# Patient Record
Sex: Male | Born: 1955 | Race: White | Hispanic: No | Marital: Married | Smoking: Former smoker
Health system: Southern US, Community
[De-identification: ages and names within clinical notes are randomized; demographics above are authoritative.]

## PROBLEM LIST (undated history)

## (undated) DIAGNOSIS — I10 Essential (primary) hypertension: Secondary | ICD-10-CM

## (undated) DIAGNOSIS — E785 Hyperlipidemia, unspecified: Secondary | ICD-10-CM

## (undated) DIAGNOSIS — I4891 Unspecified atrial fibrillation: Secondary | ICD-10-CM

## (undated) HISTORY — DX: Unspecified atrial fibrillation: I48.91

## (undated) HISTORY — DX: Essential (primary) hypertension: I10

## (undated) HISTORY — PX: APPENDECTOMY: SHX54

## (undated) HISTORY — PX: TONSILLECTOMY: SUR1361

## (undated) HISTORY — DX: Hyperlipidemia, unspecified: E78.5

## (undated) HISTORY — PX: HAMMER TOE SURGERY: SHX385

---

## 2013-12-24 ENCOUNTER — Other Ambulatory Visit: Payer: Self-pay

## 2013-12-24 DIAGNOSIS — R002 Palpitations: Secondary | ICD-10-CM

## 2013-12-30 ENCOUNTER — Encounter (INDEPENDENT_AMBULATORY_CARE_PROVIDER_SITE_OTHER): Payer: BC Managed Care – PPO

## 2013-12-30 ENCOUNTER — Encounter: Payer: Self-pay | Admitting: Radiology

## 2013-12-30 DIAGNOSIS — R002 Palpitations: Secondary | ICD-10-CM

## 2013-12-30 NOTE — Progress Notes (Signed)
Patient ID: Jon Ortiz, male   DOB: [birth year], 58 y.o.   MRN: [id number] E cardio 24hr holter applied

## 2015-01-05 DIAGNOSIS — I480 Paroxysmal atrial fibrillation: Secondary | ICD-10-CM | POA: Insufficient documentation

## 2015-01-05 HISTORY — DX: Paroxysmal atrial fibrillation: I48.0

## 2015-05-28 DIAGNOSIS — G4733 Obstructive sleep apnea (adult) (pediatric): Secondary | ICD-10-CM | POA: Insufficient documentation

## 2015-05-28 HISTORY — DX: Obstructive sleep apnea (adult) (pediatric): G47.33

## 2017-03-29 DIAGNOSIS — I10 Essential (primary) hypertension: Secondary | ICD-10-CM

## 2017-03-29 DIAGNOSIS — E785 Hyperlipidemia, unspecified: Secondary | ICD-10-CM

## 2017-03-29 HISTORY — DX: Hyperlipidemia, unspecified: E78.5

## 2017-03-29 HISTORY — DX: Essential (primary) hypertension: I10

## 2017-03-30 ENCOUNTER — Ambulatory Visit (INDEPENDENT_AMBULATORY_CARE_PROVIDER_SITE_OTHER): Payer: BLUE CROSS/BLUE SHIELD | Admitting: Cardiology

## 2017-03-30 ENCOUNTER — Encounter: Payer: Self-pay | Admitting: Cardiology

## 2017-03-30 VITALS — BP 120/80 | HR 60 | Ht 72.0 in | Wt 286.0 lb

## 2017-03-30 DIAGNOSIS — G4733 Obstructive sleep apnea (adult) (pediatric): Secondary | ICD-10-CM

## 2017-03-30 DIAGNOSIS — I480 Paroxysmal atrial fibrillation: Secondary | ICD-10-CM | POA: Diagnosis not present

## 2017-03-30 DIAGNOSIS — I10 Essential (primary) hypertension: Secondary | ICD-10-CM

## 2017-03-30 MED ORDER — METOPROLOL SUCCINATE ER 25 MG PO TB24: 25.0000 mg | ORAL_TABLET | Freq: Every day | ORAL | 1 refills | Status: DC

## 2017-03-30 MED ORDER — PRAVASTATIN SODIUM 10 MG PO TABS: 10.0000 mg | ORAL_TABLET | Freq: Every day | ORAL | 1 refills | Status: DC

## 2017-03-30 MED ORDER — FLECAINIDE ACETATE 100 MG PO TABS: 100.0000 mg | ORAL_TABLET | Freq: Every day | ORAL | 1 refills | Status: DC

## 2017-03-30 MED ORDER — TRIAMTERENE-HCTZ 37.5-25 MG PO TABS: 1.0000 | ORAL_TABLET | Freq: Every day | ORAL | 1 refills | Status: DC

## 2017-03-30 NOTE — Progress Notes (Signed)
Cardiology Office Note:    Date:  03/30/2017   ID:  Jon Ortiz, DOB [birth year], MRN [id number]  PCP:  Ailene RavelHamrick, Maura L, MD  Cardiologist:  Garwin Brothersajan R Revankar, MD   Referring MD: Ailene RavelHamrick, Maura L, MD    ASSESSMENT:    1. Essential hypertension   2. Paroxysmal atrial fibrillation (HCC)   3. OSA (obstructive sleep apnea)    PLAN:    In order of problems listed above:  1. I discussed with the patient atrial fibrillation, disease process. Management and therapy including rate and rhythm control, anticoagulation benefits and potential risks were discussed extensively with the patient. Patient had multiple questions which were answered to patient's satisfaction. 2. Patient continues to take a full-strength aspirin on a daily basis.  His chads score is 1.  He mentions to me that he is now going to exercise and walk aggressively to lose weight.  His atrial fibrillation is overall stable.  His lipids are followed by his primary care physician.  Sleep health issues were discussed.  I told him that they do have a bearing on his atrial fibrillation and he vocalized understanding. 3. Patient will be seen in follow-up appointment in 6 months or earlier if the patient has any concerns 4.    Medication Adjustments/Labs and Tests Ordered: Current medicines are reviewed at length with the patient today.  Concerns regarding medicines are outlined above.  Orders Placed This Encounter  Procedures  . EKG 12-Lead   Meds ordered this encounter  Medications  . flecainide (TAMBOCOR) 100 MG tablet    Sig: Take 1 tablet (100 mg total) by mouth daily.    Dispense:  90 tablet    Refill:  1  . metoprolol succinate (TOPROL-XL) 25 MG 24 hr tablet    Sig: Take 1 tablet (25 mg total) by mouth daily.    Dispense:  90 tablet    Refill:  1  . pravastatin (PRAVACHOL) 10 MG tablet    Sig: Take 1 tablet (10 mg total) by mouth daily.    Dispense:  90 tablet    Refill:  1  . triamterene-hydrochlorothiazide  (MAXZIDE-25) 37.5-25 MG tablet    Sig: Take 1 tablet by mouth daily.    Dispense:  90 tablet    Refill:  1     History of Present Illness:    Jon Ortiz is a 62 y.o. male who is being seen today for the evaluation of paroxysmal atrial fibrillation at the request of Hamrick, Durward FortesMaura L, MD.  This patient has been under my care in my previous practice.  He is here now to transfer his care and be established with my current practice.  Patient has been seen by me in the remote past.  He denies any chest pain orthopnea or PND.  He takes care of activities of daily living.  He does not exercise on a regular basis.  He tells me that he has started exercising but hurt his knee and is now waiting for recovery.  At the time of my evaluation, the patient is alert awake oriented and in no distress.  He mentions to me that he had 2 episodes of palpitations indicative or suggestive of atrial fibrillation since he saw me last and that resolved by themselves.  He wants a refill of his medications today.  Past Medical History:  Diagnosis Date  . A-fib (HCC)   . Hyperlipidemia   . Hypertension     History reviewed. No pertinent surgical history.  Current Medications: Current Meds  Medication Sig  . aspirin 325 MG tablet Take 1 tablet by mouth daily.  Marland Kitchen Co-Enzyme Q-10 30 MG CAPS Take 100 mg by mouth daily.  . flecainide (TAMBOCOR) 100 MG tablet Take 1 tablet (100 mg total) by mouth daily.  . metoprolol succinate (TOPROL-XL) 25 MG 24 hr tablet Take 1 tablet (25 mg total) by mouth daily.  . Multiple Vitamin (MULTI-VITAMINS) TABS Take 1 tablet by mouth daily.  . pravastatin (PRAVACHOL) 10 MG tablet Take 1 tablet (10 mg total) by mouth daily.  Marland Kitchen RA KRILL OIL 500 MG CAPS Take 1 capsule by mouth daily.  Marland Kitchen triamterene-hydrochlorothiazide (MAXZIDE-25) 37.5-25 MG tablet Take 1 tablet by mouth daily.  . [DISCONTINUED] flecainide (TAMBOCOR) 100 MG tablet Take 1 tablet by mouth daily.  . [DISCONTINUED]  metoprolol succinate (TOPROL-XL) 25 MG 24 hr tablet Take 1 tablet by mouth daily.  . [DISCONTINUED] pravastatin (PRAVACHOL) 10 MG tablet Take 1 tablet by mouth daily.  . [DISCONTINUED] triamterene-hydrochlorothiazide (MAXZIDE-25) 37.5-25 MG tablet Take 1 tablet by mouth daily.     Allergies:   Patient has no known allergies.   Social History   Socioeconomic History  . Marital status: Married    Spouse name: None  . Number of children: None  . Years of education: None  . Highest education level: None  Social Needs  . Financial resource strain: None  . Food insecurity - worry: None  . Food insecurity - inability: None  . Transportation needs - medical: None  . Transportation needs - non-medical: None  Occupational History  . None  Tobacco Use  . Smoking status: Former Games developer  . Smokeless tobacco: Never Used  Substance and Sexual Activity  . Alcohol use: No    Frequency: Never  . Drug use: No  . Sexual activity: None  Other Topics Concern  . None  Social History Narrative  . None     Family History: The patient's family history includes Arrhythmia in his father; Heart disease in his father; Valvular heart disease in his mother.  ROS:   Please see the history of present illness.    All other systems reviewed and are negative.  EKGs/Labs/Other Studies Reviewed:    The following studies were reviewed today: I reviewed records and the patient showed me the lab work from his primary care physician's office on his form.  EKG done today reveals sinus rhythm, first-degree AV block and nonspecific ST changes QRS was within normal limits.   Recent Labs: No results found for requested labs within last 8760 hours.  Recent Lipid Panel No results found for: CHOL, TRIG, HDL, CHOLHDL, VLDL, LDLCALC, LDLDIRECT  Physical Exam:    VS:  BP 120/80 (BP Location: Left Arm, Patient Position: Sitting, Cuff Size: Normal)   Pulse 60   Ht 6' (1.829 m)   Wt 286 lb (129.7 kg)   SpO2 98%    BMI 38.79 kg/m     Wt Readings from Last 3 Encounters:  03/30/17 286 lb (129.7 kg)     GEN: Patient is in no acute distress HEENT: Normal NECK: No JVD; No carotid bruits LYMPHATICS: No lymphadenopathy CARDIAC: S1 S2 regular, 2/6 systolic murmur at the apex. RESPIRATORY:  Clear to auscultation without rales, wheezing or rhonchi  ABDOMEN: Soft, non-tender, non-distended MUSCULOSKELETAL:  No edema; No deformity  SKIN: Warm and dry NEUROLOGIC:  Alert and oriented x 3 PSYCHIATRIC:  Normal affect    Signed, Garwin Brothers, MD  03/30/2017 11:26 AM  [facility]

## 2017-03-30 NOTE — Patient Instructions (Signed)
Medication Instructions:  Your physician recommends that you continue on your current medications as directed. Please refer to the Current Medication list given to you today.  Labwork: None ordered  Testing/Procedures: None ordered  Follow-Up: Your physician recommends that you schedule a follow-up appointment in: 7 months with Dr. Revankar   Any Other Special Instructions Will Be Listed Below (If Applicable).     If you need a refill on your cardiac medications before your next appointment, please call your pharmacy.   

## 2017-04-14 ENCOUNTER — Other Ambulatory Visit: Payer: Self-pay

## 2017-04-14 ENCOUNTER — Telehealth: Payer: Self-pay | Admitting: Cardiology

## 2017-04-14 MED ORDER — FLECAINIDE ACETATE 100 MG PO TABS: 100.0000 mg | ORAL_TABLET | Freq: Two times a day (BID) | ORAL | 1 refills | Status: DC

## 2017-04-14 NOTE — Telephone Encounter (Signed)
Refill changed for BID.

## 2017-04-14 NOTE — Telephone Encounter (Signed)
Patient is calling about Flecanide changes he states that he has always took it twice daily and it was called in for only 1 daily, please call patient.

## 2017-08-23 ENCOUNTER — Telehealth: Payer: Self-pay | Admitting: Cardiology

## 2017-08-23 NOTE — Telephone Encounter (Signed)
Patient states he is taking two beta blockers and gets tired while driving. Is this normal?

## 2017-08-23 NOTE — Telephone Encounter (Signed)
Left voicemail informing patient [facility] is only to be taking beta blocker once daily.

## 2017-10-04 ENCOUNTER — Telehealth: Payer: Self-pay | Admitting: Cardiology

## 2017-10-04 NOTE — Telephone Encounter (Signed)
Call Metoprolol 25mg , Lovastatin 10mg , trimetarine HCTZ and Flecinide to [facility] on [address]

## 2017-10-05 ENCOUNTER — Other Ambulatory Visit: Payer: Self-pay

## 2017-10-05 MED ORDER — TRIAMTERENE-HCTZ 37.5-25 MG PO TABS: 1.0000 | ORAL_TABLET | Freq: Every day | ORAL | 0 refills | Status: DC

## 2017-10-05 MED ORDER — FLECAINIDE ACETATE 100 MG PO TABS: 100.0000 mg | ORAL_TABLET | Freq: Two times a day (BID) | ORAL | 0 refills | Status: DC

## 2017-10-05 MED ORDER — PRAVASTATIN SODIUM 10 MG PO TABS: 10.0000 mg | ORAL_TABLET | Freq: Every day | ORAL | 0 refills | Status: DC

## 2017-10-05 MED ORDER — METOPROLOL SUCCINATE ER 25 MG PO TB24: 25.0000 mg | ORAL_TABLET | Freq: Every day | ORAL | 0 refills | Status: DC

## 2017-10-05 NOTE — Telephone Encounter (Signed)
Med refill sent to the pharmacy.

## 2017-10-30 ENCOUNTER — Ambulatory Visit (INDEPENDENT_AMBULATORY_CARE_PROVIDER_SITE_OTHER): Payer: BLUE CROSS/BLUE SHIELD | Admitting: Cardiology

## 2017-10-30 ENCOUNTER — Encounter: Payer: Self-pay | Admitting: Cardiology

## 2017-10-30 VITALS — BP 128/88 | HR 57 | Wt 286.4 lb

## 2017-10-30 DIAGNOSIS — G4733 Obstructive sleep apnea (adult) (pediatric): Secondary | ICD-10-CM

## 2017-10-30 DIAGNOSIS — E785 Hyperlipidemia, unspecified: Secondary | ICD-10-CM | POA: Diagnosis not present

## 2017-10-30 DIAGNOSIS — I480 Paroxysmal atrial fibrillation: Secondary | ICD-10-CM

## 2017-10-30 DIAGNOSIS — I10 Essential (primary) hypertension: Secondary | ICD-10-CM | POA: Diagnosis not present

## 2017-10-30 MED ORDER — TRIAMTERENE-HCTZ 37.5-25 MG PO TABS: 1.0000 | ORAL_TABLET | Freq: Every day | ORAL | 3 refills | Status: DC

## 2017-10-30 MED ORDER — FLECAINIDE ACETATE 100 MG PO TABS: 100.0000 mg | ORAL_TABLET | Freq: Two times a day (BID) | ORAL | 3 refills | Status: DC

## 2017-10-30 MED ORDER — PRAVASTATIN SODIUM 10 MG PO TABS: 10.0000 mg | ORAL_TABLET | Freq: Every day | ORAL | 3 refills | Status: DC

## 2017-10-30 MED ORDER — METOPROLOL SUCCINATE ER 25 MG PO TB24: 25.0000 mg | ORAL_TABLET | Freq: Every day | ORAL | 3 refills | Status: DC

## 2017-10-30 NOTE — Progress Notes (Signed)
Cardiology Office Note:    Date:  10/30/2017   ID:  Jon Ortiz, DOB 03-09-1956, MRN [id number]  PCP:  Ailene RavelHamrick, Maura L, MD  Cardiologist:  Garwin Brothersajan R Revankar, MD   Referring MD: Ailene RavelHamrick, Maura L, MD    ASSESSMENT:    1. Paroxysmal atrial fibrillation (HCC)   2. Essential hypertension   3. OSA (obstructive sleep apnea)   4. Dyslipidemia    PLAN:    In order of problems listed above:  1. Primary prevention stressed with the patient.  Importance of compliance with diet and medication stressed.  I emphasized to him the importance of losing weight and he vocalized understanding.  His blood pressure is stable. 2. He also has not been compliant with sleep apnea evaluation.  He uses a CPAP machine.  In view of this we will refer him to sleep specialist to evaluate him for this and optimize his therapy so that hopefully his atrial fibrillation is even better controlled.  Diet was discussed for obesity and weight reduction was stressed whether 3. Patient will be seen in follow-up appointment in 6 months or earlier if the patient has any concerns    Medication Adjustments/Labs and Tests Ordered: Current medicines are reviewed at length with the patient today.  Concerns regarding medicines are outlined above.  No orders of the defined types were placed in this encounter.  No orders of the defined types were placed in this encounter.    Chief Complaint  Patient presents with  . Follow-up    2 episodes of afib: 05/12/17: took an extra tablet of flecainide and went to bed & 09/02/17: 2:30 to 6:30 pm-HR 143     History of Present Illness:    Jon Ortiz is a 62 y.o. male.  The patient has paroxysmal atrial fibrillation.  He denies any problems at this time and takes care of activities of daily living.  No chest pain orthopnea or PND.  He does exercise on a regular basis but not optimally.  He tells me that he is doing his best to lose weight.  The patient mentions to me that he  had 2 episodes of atrial fibrillation and he thinks that it was real because of his cell phone able to pick it up.  His heart rate was in the 130s.  He took an extra dose of flecainide that day and it did resolve the symptoms.  No orthopnea or PND.  He has history of sleep apnea but has not had a sleep apnea evaluation for the past 2 years.  Past Medical History:  Diagnosis Date  . A-fib (HCC)   . Hyperlipidemia   . Hypertension     History reviewed. No pertinent surgical history.  Current Medications: Current Meds  Medication Sig  . aspirin 325 MG tablet Take 1 tablet by mouth daily.  Marland Kitchen. Co-Enzyme Q-10 30 MG CAPS Take 200 mg by mouth daily.   . flecainide (TAMBOCOR) 100 MG tablet Take 1 tablet (100 mg total) by mouth 2 (two) times daily.  . metoprolol succinate (TOPROL-XL) 25 MG 24 hr tablet Take 1 tablet (25 mg total) by mouth daily.  . Multiple Vitamin (MULTI-VITAMINS) TABS Take 3 tablets by mouth daily.   . pravastatin (PRAVACHOL) 10 MG tablet Take 1 tablet (10 mg total) by mouth daily.  Marland Kitchen. RA KRILL OIL 500 MG CAPS Take 1 capsule by mouth daily.  Marland Kitchen. triamterene-hydrochlorothiazide (MAXZIDE-25) 37.5-25 MG tablet Take 1 tablet by mouth daily.  Allergies:   Patient has no known allergies.   Social History   Socioeconomic History  . Marital status: Married    Spouse name: Not on file  . Number of children: Not on file  . Years of education: Not on file  . Highest education level: Not on file  Occupational History  . Not on file  Social Needs  . Financial resource strain: Not on file  . Food insecurity:    Worry: Not on file    Inability: Not on file  . Transportation needs:    Medical: Not on file    Non-medical: Not on file  Tobacco Use  . Smoking status: Former Games developermoker  . Smokeless tobacco: Never Used  Substance and Sexual Activity  . Alcohol use: No    Frequency: Never  . Drug use: No  . Sexual activity: Not on file  Lifestyle  . Physical activity:    Days per  week: Not on file    Minutes per session: Not on file  . Stress: Not on file  Relationships  . Social connections:    Talks on phone: Not on file    Gets together: Not on file    Attends religious service: Not on file    Active member of club or organization: Not on file    Attends meetings of clubs or organizations: Not on file    Relationship status: Not on file  Other Topics Concern  . Not on file  Social History Narrative  . Not on file     Family History: The patient's family history includes Arrhythmia in his father; Heart disease in his father; Valvular heart disease in his mother.  ROS:   Please see the history of present illness.    All other systems reviewed and are negative.  EKGs/Labs/Other Studies Reviewed:    The following studies were reviewed today: EKG reveals sinus rhythm first-degree AV block and nonspecific ST-T changes   Recent Labs: No results found for requested labs within last 8760 hours.  Recent Lipid Panel No results found for: CHOL, TRIG, HDL, CHOLHDL, VLDL, LDLCALC, LDLDIRECT  Physical Exam:    VS:  BP 128/88 (BP Location: Right Arm, Patient Position: Sitting, Cuff Size: Large)   Pulse (!) 57   Wt 286 lb 6.4 oz (129.9 kg)   SpO2 97%   BMI 38.84 kg/m     Wt Readings from Last 3 Encounters:  10/30/17 286 lb 6.4 oz (129.9 kg)  03/30/17 286 lb (129.7 kg)     GEN: Patient is in no acute distress HEENT: Normal NECK: No JVD; No carotid bruits LYMPHATICS: No lymphadenopathy CARDIAC: Hear sounds regular, 2/6 systolic murmur at the apex. RESPIRATORY:  Clear to auscultation without rales, wheezing or rhonchi  ABDOMEN: Soft, non-tender, non-distended MUSCULOSKELETAL:  No edema; No deformity  SKIN: Warm and dry NEUROLOGIC:  Alert and oriented x 3 PSYCHIATRIC:  Normal affect   Signed, Garwin Brothersajan R Revankar, MD  10/30/2017 11:27 AM    [facility]

## 2017-10-30 NOTE — Patient Instructions (Signed)
Medication Instructions:  Your physician recommends that you continue on your current medications as directed. Please refer to the Current Medication list given to you today.   Labwork: Your physician recommends that you return for lab work today: CBC, BMP.   Testing/Procedures: You had an EKG today.   Your physician has recommended that you have a sleep study. This test records several body functions during sleep, including: brain activity, eye movement, oxygen and carbon dioxide blood levels, heart rate and rhythm, breathing rate and rhythm, the flow of air through your mouth and nose, snoring, body muscle movements, and chest and belly movement. You will be contacted to schedule this appointment at [facility] located at [address] [place], 3rd floor.   Follow-Up: Your physician wants you to follow-up in: 6 months. You will receive a reminder letter in the mail two months in advance. If you don't receive a letter, please call our office to schedule the follow-up appointment.   If you need a refill on your cardiac medications before your next appointment, please call your pharmacy.   Thank you for choosing [facility]! Mady Gemmaatherine Azel Gumina, RN [phone]

## 2017-10-31 ENCOUNTER — Telehealth: Payer: Self-pay

## 2017-10-31 ENCOUNTER — Other Ambulatory Visit: Payer: Self-pay | Admitting: *Deleted

## 2017-10-31 DIAGNOSIS — G4733 Obstructive sleep apnea (adult) (pediatric): Secondary | ICD-10-CM

## 2017-10-31 LAB — CBC
HEMATOCRIT: 47.6 % (ref 37.5–51.0)
HEMOGLOBIN: 16.2 g/dL (ref 13.0–17.7)
MCH: 31.9 pg (ref 26.6–33.0)
MCHC: 34 g/dL (ref 31.5–35.7)
MCV: 94 fL (ref 79–97)
Platelets: 218 10*3/uL (ref 150–450)
RBC: 5.08 x10E6/uL (ref 4.14–5.80)
RDW: 14.7 % (ref 12.3–15.4)
WBC: 7.1 10*3/uL (ref 3.4–10.8)

## 2017-10-31 LAB — BASIC METABOLIC PANEL
BUN / CREAT RATIO: 18 (ref 10–24)
BUN: 21 mg/dL (ref 8–27)
CO2: 26 mmol/L (ref 20–29)
CREATININE: 1.14 mg/dL (ref 0.76–1.27)
Calcium: 9.5 mg/dL (ref 8.6–10.2)
Chloride: 103 mmol/L (ref 96–106)
GFR calc Af Amer: 79 mL/min/{1.73_m2} (ref >59)
GFR, EST NON AFRICAN AMERICAN: 69 mL/min/{1.73_m2} (ref >59)
GLUCOSE: 90 mg/dL (ref 65–99)
POTASSIUM: 4.2 mmol/L (ref 3.5–5.2)
Sodium: 143 mmol/L (ref 134–144)

## 2017-10-31 NOTE — Progress Notes (Signed)
Patient informed that referral for pulmonology has been placed and that Dr. Evlyn CourierSood's office should contact him to schedule an appointment. Patient verbalized understanding. No further questions.

## 2017-10-31 NOTE — Telephone Encounter (Signed)
Precert request for sleep study received from Dr Tomie Chinaevankar. Per the [employer] portal pt had a sleep study approved in 2018 ordered by Dr Blenda Nicelyhodri. He has a CPAP machine it appears he is noncompliant with. Spoke with Mady Gemmaatherine Lockhart to clarify request for sleep study versus referral to sleep specialist as mentioned in office note. Awaiting response.

## 2018-05-15 ENCOUNTER — Telehealth: Payer: Self-pay | Admitting: Cardiology

## 2018-05-15 NOTE — Telephone Encounter (Signed)
Patient wants to know what he can take for a stuffy nose / cold symptoms with his Afib.  Please call patient to discuss

## 2018-05-16 NOTE — Telephone Encounter (Signed)
Left message for patient to return call concerning cold and OTC medications.

## 2018-05-16 NOTE — Telephone Encounter (Signed)
Patient returned call and advised to start Mucinex OTC for sinus congestion by Dr.K. Patient in agreement and states he will call back if symptoms do not resolve. No SOB, no cardiac symptoms just a head cold and concerned about OTC medications in conjunction with his Afib.

## 2018-10-18 ENCOUNTER — Other Ambulatory Visit: Payer: Self-pay | Admitting: Cardiology

## 2018-10-18 NOTE — Telephone Encounter (Signed)
Flecainide, metoprolol, maxide and pravastatin refills sent to [facility].

## 2018-11-12 ENCOUNTER — Ambulatory Visit: Payer: BLUE CROSS/BLUE SHIELD | Admitting: Cardiology

## 2018-11-22 ENCOUNTER — Ambulatory Visit: Payer: BLUE CROSS/BLUE SHIELD | Admitting: Cardiology

## 2018-11-26 ENCOUNTER — Encounter: Payer: Self-pay | Admitting: Cardiology

## 2018-11-26 ENCOUNTER — Other Ambulatory Visit: Payer: Self-pay

## 2018-11-26 ENCOUNTER — Ambulatory Visit (INDEPENDENT_AMBULATORY_CARE_PROVIDER_SITE_OTHER): Payer: BC Managed Care – PPO | Admitting: Cardiology

## 2018-11-26 VITALS — BP 130/80 | HR 56 | Ht 72.0 in | Wt 292.0 lb

## 2018-11-26 DIAGNOSIS — E785 Hyperlipidemia, unspecified: Secondary | ICD-10-CM | POA: Diagnosis not present

## 2018-11-26 DIAGNOSIS — I480 Paroxysmal atrial fibrillation: Secondary | ICD-10-CM

## 2018-11-26 DIAGNOSIS — R011 Cardiac murmur, unspecified: Secondary | ICD-10-CM

## 2018-11-26 DIAGNOSIS — I10 Essential (primary) hypertension: Secondary | ICD-10-CM

## 2018-11-26 DIAGNOSIS — G4733 Obstructive sleep apnea (adult) (pediatric): Secondary | ICD-10-CM

## 2018-11-26 MED ORDER — FLECAINIDE ACETATE 150 MG PO TABS: 150.0000 mg | ORAL_TABLET | Freq: Two times a day (BID) | ORAL | 4 refills | Status: DC

## 2018-11-26 NOTE — Progress Notes (Signed)
Cardiology Office Note:    Date:  11/26/2018   ID:  Jon Ortiz, DOB 10-Dec-1955, MRN [id number]  PCP:  Jon Sake, MD  Cardiologist:  Jon Lindau, MD   Referring MD: Jon Sake, MD    ASSESSMENT:    1. Paroxysmal atrial fibrillation (HCC)   2. Essential hypertension   3. OSA (obstructive sleep apnea)   4. Dyslipidemia    PLAN:    In order of problems listed above:  1. Paroxysmal atrial fibrillation: It appears that his atrial fibrillation is acting up a little more than expected.  I reviewed his blood work.  I reviewed his EKG.  I will increase flecainide to 150 mg twice daily.  He will be back in 8 to 10 days for follow-up EKG.  In view of this medication he will have a Lexiscan sestamibi.  Echocardiogram will be done to assess murmur heard on auscultation and issues such as left atrial size.  If this medication is not very useful I may try dronedarone in the future or even consider atrial fibrillation ablation ablation and these issues were discussed with the patient at length and questions were answered to her satisfaction. 2. Essential hypertension: Blood pressure is stable 3. Morbid obesity: Diet was discussed importance of compliance with diet and medication stressed.  I reviewed his lipids from his blood work reports 4. Patient will be seen in follow-up appointment in 2 months or earlier if the patient has any concerns    Medication Adjustments/Labs and Tests Ordered: Current medicines are reviewed at length with the patient today.  Concerns regarding medicines are outlined above.  No orders of the defined types were placed in this encounter.  No orders of the defined types were placed in this encounter.    Chief Complaint  Patient presents with  . Follow-up     History of Present Illness:    Jon Ortiz is a 63 y.o. male.  Patient has past medical history approximately fibrillation, essential hypertension and  dyslipidemia.  He denies any problems at this time except the fact that he can feel some palpitations sometimes and tells me that his apple watch has documented atrial fibrillation.  He tried to show it to me but was not successful.  At the time of my evaluation, the patient is alert awake oriented and in no distress.  Past Medical History:  Diagnosis Date  . A-fib ([place])   . Hyperlipidemia   . Hypertension     No past surgical history on file.  Current Medications: Current Meds  Medication Sig  . aspirin 325 MG tablet Take 1 tablet by mouth daily.  . cetirizine (ZYRTEC) 10 MG tablet Take 10 mg by mouth daily.  . ciclopirox (LOPROX) 0.77 % SUSP   . Co-Enzyme Q-10 30 MG CAPS Take 200 mg by mouth daily.   . flecainide (TAMBOCOR) 100 MG tablet TAKE ONE TABLET BY MOUTH TWICE DAILY  . ketoconazole (NIZORAL) 2 % shampoo   . metoprolol succinate (TOPROL-XL) 25 MG 24 hr tablet TAKE 1 TABLET BY MOUTH DAILY  . Multiple Vitamin (MULTI-VITAMINS) TABS Take 3 tablets by mouth daily.   . pravastatin (PRAVACHOL) 10 MG tablet TAKE 1 TABLET BY MOUTH DAILY  . RA KRILL OIL 500 MG CAPS Take 1 capsule by mouth daily.  Marland Kitchen triamterene-hydrochlorothiazide (MAXZIDE-25) 37.5-25 MG tablet TAKE 1 TABLET BY MOUTH DAILY     Allergies:   Patient has no known allergies.   Social History  Socioeconomic History  . Marital status: Married    Spouse name: Not on file  . Number of children: Not on file  . Years of education: Not on file  . Highest education level: Not on file  Occupational History  . Not on file  Social Needs  . Financial resource strain: Not on file  . Food insecurity    Worry: Not on file    Inability: Not on file  . Transportation needs    Medical: Not on file    Non-medical: Not on file  Tobacco Use  . Smoking status: Former Games developermoker  . Smokeless tobacco: Never Used  Substance and Sexual Activity  . Alcohol use: No    Frequency: Never  . Drug use: No  . Sexual activity: Not on file   Lifestyle  . Physical activity    Days per week: Not on file    Minutes per session: Not on file  . Stress: Not on file  Relationships  . Social Musicianconnections    Talks on phone: Not on file    Gets together: Not on file    Attends religious service: Not on file    Active member of club or organization: Not on file    Attends meetings of clubs or organizations: Not on file    Relationship status: Not on file  Other Topics Concern  . Not on file  Social History Narrative  . Not on file     Family History: The patient's family history includes Arrhythmia in his father; Heart disease in his father; Valvular heart disease in his mother.  ROS:   Please see the history of present illness.    All other systems reviewed and are negative.  EKGs/Labs/Other Studies Reviewed:    The following studies were reviewed today: I reviewed on his cell phone recent blood work done at primary care physician's office.  EKG reveals sinus rhythm first-degree AV block and nonspecific ST-T changes   Recent Labs: No results found for requested labs within last 8760 hours.  Recent Lipid Panel No results found for: CHOL, TRIG, HDL, CHOLHDL, VLDL, LDLCALC, LDLDIRECT  Physical Exam:    VS:  BP 130/80 (BP Location: Right Arm, Patient Position: Sitting, Cuff Size: Large)   Pulse (!) 56   Ht 6' (1.829 m)   Wt 292 lb (132.5 kg)   SpO2 97%   BMI 39.60 kg/m     Wt Readings from Last 3 Encounters:  11/26/18 292 lb (132.5 kg)  10/30/17 286 lb 6.4 oz (129.9 kg)  03/30/17 286 lb (129.7 kg)     GEN: Patient is in no acute distress HEENT: Normal NECK: No JVD; No carotid bruits LYMPHATICS: No lymphadenopathy CARDIAC: Hear sounds regular, 2/6 systolic murmur at the apex. RESPIRATORY:  Clear to auscultation without rales, wheezing or rhonchi  ABDOMEN: Soft, non-tender, non-distended MUSCULOSKELETAL:  No edema; No deformity  SKIN: Warm and dry NEUROLOGIC:  Alert and oriented x 3 PSYCHIATRIC:  Normal  affect   Signed, Jon Brothersajan R Samah Lapiana, MD  11/26/2018 2:12 PM    [facility]

## 2018-11-26 NOTE — Patient Instructions (Signed)
Medication Instructions:  Your physician has recommended you make the following change in your medication:   START taking flecainide 150 mg (1 tablet) twice daily If you need a refill on your cardiac medications before your next appointment, please call your pharmacy.   Lab work: NONE If you have labs (blood work) drawn today and your tests are completely normal, you will receive your results only by:  [place] (if you have MyChart) OR  A paper copy in the mail If you have any lab test that is abnormal or we need to change your treatment, we will call you to review the results.  Testing/Procedures: You had an EKG performed today.  Your physician has requested that you have an echocardiogram. Echocardiography is a painless test that uses sound waves to create images of your heart. It provides your doctor with information about the size and shape of your heart and how well your hearts chambers and valves are working. This procedure takes approximately one hour. There are no restrictions for this procedure.  Your physician has requested that you have a lexiscan myoview. For further information please visit [email or web address]. Please follow instruction sheet, as given.    Follow-Up: At [facility], you and your health needs are our priority.  As part of our continuing mission to provide you with exceptional heart care, we have created designated Provider Care Teams.  These Care Teams include your primary Cardiologist (physician) and Advanced Practice Providers (APPs -  Physician Assistants and Nurse Practitioners) who all work together to provide you with the care you need, when you need it. You will need a follow up appointment in 2 months.   Any Other Special Instructions Will Be Listed Below  Regadenoson injection What is this medicine? REGADENOSON is used to test the heart for coronary artery disease. It is used in patients who can not exercise for their stress test. This  medicine may be used for other purposes; ask your health care provider or pharmacist if you have questions. COMMON BRAND NAME(S): Lexiscan What should I tell my health care provider before I take this medicine? They need to know if you have any of these conditions:  heart problems  lung or breathing disease, like asthma or COPD  an unusual or allergic reaction to regadenoson, other medicines, foods, dyes, or preservatives  pregnant or trying to get pregnant  breast-feeding How should I use this medicine? This medicine is for injection into a vein. It is given by a health care professional in a hospital or clinic setting. Talk to your pediatrician regarding the use of this medicine in children. Special care may be needed. Overdosage: If you think you have taken too much of this medicine contact a poison control center or emergency room at once. NOTE: This medicine is only for you. Do not share this medicine with others. What if I miss a dose? This does not apply. What may interact with this medicine?  caffeine  dipyridamole  guarana  theophylline This list may not describe all possible interactions. Give your health care provider a list of all the medicines, herbs, non-prescription drugs, or dietary supplements you use. Also tell them if you smoke, drink alcohol, or use illegal drugs. Some items may interact with your medicine. What should I watch for while using this medicine? Your condition will be monitored carefully while you are receiving this medicine. Do not take medicines, foods, or drinks with caffeine (like coffee, tea, or colas) for at least 12 hours before  your test. If you do not know if something contains caffeine, ask your health care professional. What side effects may I notice from receiving this medicine? Side effects that you should report to your doctor or health care professional as soon as possible:  allergic reactions like skin rash, itching or hives,  swelling of the face, lips, or tongue  breathing problems  chest pain, tightness or palpitations  severe headache Side effects that usually do not require medical attention (report to your doctor or health care professional if they continue or are bothersome):  flushing  headache  irritation or pain at site where injected  nausea, vomiting This list may not describe all possible side effects. Call your doctor for medical advice about side effects. You may report side effects to FDA at 1-800-FDA-1088. Where should I keep my medicine? This drug is given in a hospital or clinic and will not be stored at home. NOTE: This sheet is a summary. It may not cover all possible information. If you have questions about this medicine, talk to your doctor, pharmacist, or health care provider.  2020 Elsevier/Gold Standard (2007-10-29 15:08:13)  Cardiac Nuclear Scan A cardiac nuclear scan is a test that is done to check the flow of blood to your heart. It is done when you are resting and when you are exercising. The test looks for problems such as:  Not enough blood reaching a portion of the heart.  The heart muscle not working as it should. You may need this test if:  You have heart disease.  You have had lab results that are not normal.  You have had heart surgery or a balloon procedure to open up blocked arteries (angioplasty).  You have chest pain.  You have shortness of breath. In this test, a special dye (tracer) is put into your bloodstream. The tracer will travel to your heart. A camera will then take pictures of your heart to see how the tracer moves through your heart. This test is usually done at a hospital and takes 2-4 hours. Tell a doctor about:  Any allergies you have.  All medicines you are taking, including vitamins, herbs, eye drops, creams, and over-the-counter medicines.  Any problems you or family members have had with anesthetic medicines.  Any blood disorders  you have.  Any surgeries you have had.  Any medical conditions you have.  Whether you are pregnant or may be pregnant. What are the risks? Generally, this is a safe test. However, problems may occur, such as:  Serious chest pain and heart attack. This is only a risk if the stress portion of the test is done.  Rapid heartbeat.  A feeling of warmth in your chest. This feeling usually does not last long.  Allergic reaction to the tracer. What happens before the test?  Ask your doctor about changing or stopping your normal medicines. This is important.  Follow instructions from your doctor about what you cannot eat or drink.  Remove your jewelry on the day of the test. What happens during the test?  An IV tube will be inserted into one of your veins.  Your doctor will give you a small amount of tracer through the IV tube.  You will wait for 20-40 minutes while the tracer moves through your bloodstream.  Your heart will be monitored with an electrocardiogram (ECG).  You will lie down on an exam table.  Pictures of your heart will be taken for about 15-20 minutes.  You may also  have a stress test. For this test, one of these things may be done: ? You will be asked to exercise on a treadmill or a stationary bike. ? You will be given medicines that will make your heart work harder. This is done if you are unable to exercise.  When blood flow to your heart has peaked, a tracer will again be given through the IV tube.  After 20-40 minutes, you will get back on the exam table. More pictures will be taken of your heart.  Depending on the tracer that is used, more pictures may need to be taken 3-4 hours later.  Your IV tube will be removed when the test is over. The test may vary among doctors and hospitals. What happens after the test?  Ask your doctor: ? Whether you can return to your normal schedule, including diet, activities, and medicines. ? Whether you should drink  more fluids. This will help to remove the tracer from your body. Drink enough fluid to keep your pee (urine) pale yellow.  Ask your doctor, or the department that is doing the test: ? When will my results be ready? ? How will I get my results? Summary  A cardiac nuclear scan is a test that is done to check the flow of blood to your heart.  Tell your doctor whether you are pregnant or may be pregnant.  Before the test, ask your doctor about changing or stopping your normal medicines. This is important.  Ask your doctor whether you can return to your normal activities. You may be asked to drink more fluids. This information is not intended to replace advice given to you by your health care provider. Make sure you discuss any questions you have with your health care provider. Document Released: 08/14/2017 Document Revised: 06/20/2018 Document Reviewed: 08/14/2017 Elsevier Patient Education  2020 [employer].  Echocardiogram An echocardiogram is a procedure that uses painless sound waves (ultrasound) to produce an image of the heart. Images from an echocardiogram can provide important information about:  Signs of coronary artery disease (CAD).  Aneurysm detection. An aneurysm is a weak or damaged part of an artery wall that bulges out from the normal force of blood pumping through the body.  Heart size and shape. Changes in the size or shape of the heart can be associated with certain conditions, including heart failure, aneurysm, and CAD.  Heart muscle function.  Heart valve function.  Signs of a past heart attack.  Fluid buildup around the heart.  Thickening of the heart muscle.  A tumor or infectious growth around the heart valves. Tell a health care provider about:  Any allergies you have.  All medicines you are taking, including vitamins, herbs, eye drops, creams, and over-the-counter medicines.  Any blood disorders you have.  Any surgeries you have had.  Any  medical conditions you have.  Whether you are pregnant or may be pregnant. What are the risks? Generally, this is a safe procedure. However, problems may occur, including:  Allergic reaction to dye (contrast) that may be used during the procedure. What happens before the procedure? No specific preparation is needed. You may eat and drink normally. What happens during the procedure?   An IV tube may be inserted into one of your veins.  You may receive contrast through this tube. A contrast is an injection that improves the quality of the pictures from your heart.  A gel will be applied to your chest.  A wand-like tool (transducer) will  be moved over your chest. The gel will help to transmit the sound waves from the transducer.  The sound waves will harmlessly bounce off of your heart to allow the heart images to be captured in real-time motion. The images will be recorded on a computer. The procedure may vary among health care providers and hospitals. What happens after the procedure?  You may return to your normal, everyday life, including diet, activities, and medicines, unless your health care provider tells you not to do that. Summary  An echocardiogram is a procedure that uses painless sound waves (ultrasound) to produce an image of the heart.  Images from an echocardiogram can provide important information about the size and shape of your heart, heart muscle function, heart valve function, and fluid buildup around your heart.  You do not need to do anything to prepare before this procedure. You may eat and drink normally.  After the echocardiogram is completed, you may return to your normal, everyday life, unless your health care provider tells you not to do that. This information is not intended to replace advice given to you by your health care provider. Make sure you discuss any questions you have with your health care provider. Document Released: 02/26/2000 Document  Revised: 06/21/2018 Document Reviewed: 04/02/2016 Elsevier Patient Education  2020 Elsevier Inc  Flecainide tablets What is this medicine? FLECAINIDE (FLEK a nide) is an antiarrhythmic drug. This medicine is used to prevent irregular heart rhythm. It can also slow down fast heartbeats called tachycardia. This medicine may be used for other purposes; ask your health care provider or pharmacist if you have questions. COMMON BRAND NAME(S): Tambocor What should I tell my health care provider before I take this medicine? They need to know if you have any of these conditions:  abnormal levels of potassium in the blood  heart disease including heart rhythm and heart rate problems  kidney or liver disease  recent heart attack  an unusual or allergic reaction to flecainide, local anesthetics, other medicines, foods, dyes, or preservatives  pregnant or trying to get pregnant  breast-feeding How should I use this medicine? Take this medicine by mouth with a glass of water. Follow the directions on the prescription label. You can take this medicine with or without food. Take your doses at regular intervals. Do not take your medicine more often than directed. Do not stop taking this medicine suddenly. This may cause serious, heart-related side effects. If your doctor wants you to stop the medicine, the dose may be slowly lowered over time to avoid any side effects. Talk to your pediatrician regarding the use of this medicine in children. While this drug may be prescribed for children as young as 1 year of age for selected conditions, precautions do apply. Overdosage: If you think you have taken too much of this medicine contact a poison control center or emergency room at once. NOTE: This medicine is only for you. Do not share this medicine with others. What if I miss a dose? If you miss a dose, take it as soon as you can. If it is almost time for your next dose, take only that dose. Do not take  double or extra doses. What may interact with this medicine? Do not take this medicine with any of the following medications:  amoxapine  arsenic trioxide  certain antibiotics like clarithromycin, erythromycin, gatifloxacin, gemifloxacin, levofloxacin, moxifloxacin, sparfloxacin, or troleandomycin  certain antidepressants called tricyclic antidepressants like amitriptyline, imipramine, or nortriptyline  certain medicines to control  heart rhythm like disopyramide, encainide, moricizine, procainamide, propafenone, and quinidine  cisapride  delavirdine  droperidol  haloperidol  [place]  imatinib  levomethadyl  maprotiline  medicines for malaria like chloroquine and halofantrine  pentamidine  phenothiazines like chlorpromazine, mesoridazine, prochlorperazine, thioridazine  pimozide  quinine  ranolazine  ritonavir  sertindole This medicine may also interact with the following medications:  cimetidine  dofetilide  medicines for angina or high blood pressure  medicines to control heart rhythm like amiodarone and digoxin  ziprasidone This list may not describe all possible interactions. Give your health care provider a list of all the medicines, herbs, non-prescription drugs, or dietary supplements you use. Also tell them if you smoke, drink alcohol, or use illegal drugs. Some items may interact with your medicine. What should I watch for while using this medicine? Visit your doctor or health care professional for regular checks on your progress. Because your condition and the use of this medicine carries some risk, it is a good idea to carry an identification card, necklace or bracelet with details of your condition, medications and doctor or health care professional. Check your blood pressure and pulse rate regularly. Ask your health care professional what your blood pressure and pulse rate should be, and when you should contact him or her. Your doctor or health  care professional also may schedule regular blood tests and electrocardiograms to check your progress. You may get drowsy or dizzy. Do not drive, use machinery, or do anything that needs mental alertness until you know how this medicine affects you. Do not stand or sit up quickly, especially if you are an older patient. This reduces the risk of dizzy or fainting spells. Alcohol can make you more dizzy, increase flushing and rapid heartbeats. Avoid alcoholic drinks. What side effects may I notice from receiving this medicine? Side effects that you should report to your doctor or health care professional as soon as possible:  chest pain, continued irregular heartbeats  difficulty breathing  swelling of the legs or feet  trembling, shaking  unusually weak or tired Side effects that usually do not require medical attention (report to your doctor or health care professional if they continue or are bothersome):  blurred vision  constipation  headache  nausea, vomiting  stomach pain This list may not describe all possible side effects. Call your doctor for medical advice about side effects. You may report side effects to FDA at 1-800-FDA-1088. Where should I keep my medicine? Keep out of the reach of children. Store at room temperature between 15 and 30 degrees C (59 and 86 degrees F). Protect from light. Keep container tightly closed. Throw away any unused medicine after the expiration date. NOTE: This sheet is a summary. It may not cover all possible information. If you have questions about this medicine, talk to your doctor, pharmacist, or health care provider.  2020 Elsevier/Gold Standard (2018-02-19 11:41:38) .

## 2018-11-26 NOTE — Addendum Note (Signed)
Addended by: Beckey Rutter on: 11/26/2018 02:54 PM   Modules accepted: Orders

## 2018-11-27 ENCOUNTER — Other Ambulatory Visit: Payer: Self-pay | Admitting: Cardiology

## 2018-12-12 DIAGNOSIS — Z79899 Other long term (current) drug therapy: Secondary | ICD-10-CM

## 2018-12-12 HISTORY — DX: Other long term (current) drug therapy: Z79.899

## 2018-12-12 NOTE — Progress Notes (Signed)
Office Visit    Patient Name: Jon Ortiz Date of Encounter: 12/12/2018  Primary Care Provider:  Leonides Sake, MD Primary Cardiologist:  Jenean Lindau, MD Electrophysiologist:  None   Chief Complaint    Giovoni Robert [place] Ortiz is a 63 y.o. male with a hx of PAF, allergic rhinitis, HLD, HTN presents today for EKG and follow up after up-titration of Flecainide.    Past Medical History    Past Medical History:  Diagnosis Date  . A-fib ([place])   . Hyperlipidemia   . Hypertension    No past surgical history on file.  Allergies  No Known Allergies  History of Present Illness    Jon Ortiz is a 63 y.o. male with a hx of PAF, allergic rhinitis, HLD, HTN last seen by Dr. Geraldo Pitter 11/26/18. At that office visit he noted his atrial fibrillation was occurring more frequently and he had observed it on his Apple Watch. His Flecainide dose was increased and he was recommended to undergo Lexiscan and echocardiogram. His echo and Lexiscan are ordered for 12/27/18 and 01/01/19, respectively.   PAF diagnosed about four years ago. He had episodes March, June, October 2019. This year tells me he had episodes Feb, April, plus two more 2020. They seem to occurring more frequently. They also are increased in duration - used to last an hour, but now have lasted through the night.   He reports no recurrent episodes since his Flecainide dose was increased 11/26/18. We discussed drawing a laboratory level to check flecainide level and he is hesitant as he "just had labs done".   We discussed PAF and risk of stroke. He is presently on a full strength aspirin. We discussed that after his upcoming testing, we will re-evaluate his CHADS2VASC score and stroke risk - informed him that he may require anticoagulation with DOAC.   EKGs/Labs/Other Studies Reviewed:   The following studies were reviewed today:  EKG:  EKG is ordered today.  The ekg ordered today  demonstrates SR with 1st degree AV block and QTc 422.   Recent Labs: 08/09/18 via [facility]:  A1c 5.5 ALT 28 AST 16 GFR 79 K 4.1  10/2017 Hb 16.2 via [facility] 04/2017 TSH 1.42 via [facility]   Recent Lipid Panel 08/09/18 via [facility]: Total cholesterol 157  HDL 45 LDL 81 Triglycerides 157  Home Medications   No outpatient medications have been marked as taking for the 12/14/18 encounter (Appointment) with Loel Dubonnet, NP.    Review of Systems     ROS All other systems reviewed and are otherwise negative except as noted above.  Physical Exam    VS:  There were no vitals taken for this visit. , BMI There is no height or weight on file to calculate BMI. GEN: Well nourished, well developed, in no acute distress. HEENT: normal. Neck: Supple, no JVD, carotid bruits, or masses. Cardiac: RRR, no murmurs, rubs, or gallops. No clubbing, cyanosis, edema.  Radials/DP/PT 2+ and equal bilaterally.  Respiratory:  Respirations regular and unlabored, clear to auscultation bilaterally. GI: Soft, nontender, nondistended, BS + x 4. MS: No deformity or atrophy. Skin: Warm and dry, no rash. Neuro:  Strength and sensation are intact. Psych: Normal affect.  Accessory Clinical Findings    ECG personally reviewed by me today - SR with 1st degree AV block and QTc 422 - no acute changes.  Assessment & Plan    1. PAF - Denies recurrent episode since dose increase.  EKG today SR with 1st degree AV block and QTc 422 - no QT prolongation after increased dose of Flecainide. Continue Metoprolol and Flecainide. Per primary cardiologist Dr. Tomie China  CHADS2VASC=2. Will reassess CHADS2VASC score after upcoming echo/Lexiscan. Consider need for anticoagulation at follow up with Dr. Tomie China. Continue Aspirin 325mg  daily.  2. High risk medication use - Flecainide secondary to PAF. He is on Metoprolol for rate control. Upcoming echo and lexiscan to rule out structural heart disease or coronary artery disease in setting of  Flecainide. QTC today after increased dose Flecainide was 422, no evidence QTc prolongation.   Consider flecainide level with next labs, he politely declines labs today.   3. HTN - Follows with PCP. Goal BP <130/80. Continue present regimen.   4. DLD - Follows with PCP. Lipid profile 08/09/18 with total cholesterol 157, HDL 45, LDL 81, triglycerides 157. In absence of evidence of CAD LDL<100 is reasonable goal. Continue pravastatin. If upcoming Lexiscan with evidence of CAD, updated LDL goal <70.  5. OSA - Noted hx. No sleep study last two years. Consider for etiology of increased episdoe PAF. Sleep study ordered at last OV.   Disposition: Lexiscan and echocardiogram as scheduled. Follow up in 1 month(s) with Dr. 08/11/18.  Tomie China, NP 12/12/2018, 11:42 AM

## 2018-12-14 ENCOUNTER — Encounter: Payer: Self-pay | Admitting: Family

## 2018-12-14 ENCOUNTER — Ambulatory Visit (INDEPENDENT_AMBULATORY_CARE_PROVIDER_SITE_OTHER): Payer: BC Managed Care – PPO | Admitting: Family

## 2018-12-14 ENCOUNTER — Other Ambulatory Visit: Payer: Self-pay

## 2018-12-14 VITALS — BP 138/90 | HR 58 | Temp 97.4°F | Ht 76.0 in | Wt 287.0 lb

## 2018-12-14 DIAGNOSIS — I10 Essential (primary) hypertension: Secondary | ICD-10-CM | POA: Diagnosis not present

## 2018-12-14 DIAGNOSIS — E785 Hyperlipidemia, unspecified: Secondary | ICD-10-CM | POA: Diagnosis not present

## 2018-12-14 DIAGNOSIS — Z79899 Other long term (current) drug therapy: Secondary | ICD-10-CM

## 2018-12-14 DIAGNOSIS — I480 Paroxysmal atrial fibrillation: Secondary | ICD-10-CM | POA: Diagnosis not present

## 2018-12-14 DIAGNOSIS — G4733 Obstructive sleep apnea (adult) (pediatric): Secondary | ICD-10-CM

## 2018-12-14 NOTE — Patient Instructions (Signed)
Medication Instructions:  No medication changes today.   If you need a refill on your cardiac medications before your next appointment, please call your pharmacy.    Lab work: None today. Consider flecainide level with next set of labs.   If you have labs (blood work) drawn today and your tests are completely normal, you will receive your results only by: [facility] MyChart Message (if you have MyChart) OR . A paper copy in the mail If you have any lab test that is abnormal or we need to change your treatment, we will call you to review the results.  Testing/Procedures:  Upcoming echocardiogram and stress test.   Follow-Up: At [facility], you and your health needs are our priority.  As part of our continuing mission to provide you with exceptional heart care, we have created designated Provider Care Teams.  These Care Teams include your primary Cardiologist (physician) and Advanced Practice Providers (APPs -  Physician Assistants and Nurse Practitioners) who all work together to provide you with the care you need, when you need it. You will need a follow up appointment in 1 months. You may see Jenean Lindau, MD or another member of our [place] Provider Team in [place]: Jenne Campus, MD . Shirlee More, MD  Any Other Special Instructions Will Be Listed Below (If Applicable).  ON the day of your echocardiogram, you may take all of your normal medications.   On day of Lexiscan (stress test) hold Triamterene/HCTZ, Metoprolol, and Flecainide the morning of.   Medications and arrhthymias... 1. Avoid all over-the-counter antihistamines except Claritin/Loratadine and Zyrtec/Cetrizine. 2. Avoid all combination including cold sinus allergies flu decongestant and sleep medications 3. You can use Robitussin DM Mucinex and Mucinex DM for cough. 4. can use Tylenol aspirin ibuprofen and naproxen but no combinations such as sleep or sinus.

## 2018-12-26 ENCOUNTER — Telehealth (HOSPITAL_COMMUNITY): Payer: Self-pay | Admitting: *Deleted

## 2018-12-26 NOTE — Telephone Encounter (Signed)
Left message on voicemail per DPR in reference to upcoming appointment scheduled on 01/01/19 with detailed instructions given per Myocardial Perfusion Study Information Sheet for the test. LM to arrive 15 minutes early, and that it is imperative to arrive on time for appointment to keep from having the test rescheduled. If you need to cancel or reschedule your appointment, please call the office within 24 hours of your appointment. Failure to do so may result in a cancellation of your appointment, and a $50 no show fee. Phone number given for call back for any questions. Tylik Treese Jacqueline    

## 2018-12-27 ENCOUNTER — Other Ambulatory Visit: Payer: Self-pay

## 2018-12-27 ENCOUNTER — Ambulatory Visit (INDEPENDENT_AMBULATORY_CARE_PROVIDER_SITE_OTHER): Payer: BC Managed Care – PPO

## 2018-12-27 DIAGNOSIS — R011 Cardiac murmur, unspecified: Secondary | ICD-10-CM | POA: Diagnosis not present

## 2018-12-27 NOTE — Progress Notes (Signed)
Complete echocardiogram has been performed.  Jimmy Alika Eppes RDCS, RVT 

## 2018-12-28 ENCOUNTER — Telehealth: Payer: Self-pay | Admitting: Emergency Medicine

## 2018-12-28 NOTE — Telephone Encounter (Signed)
Patient returned call

## 2018-12-28 NOTE — Telephone Encounter (Signed)
Called patient back. Informed him of echocardiogram results. Informed him Dr. Geraldo Pitter is requesting labs to prepare for a CT. Patient doesn't wish to do this without discussing with Dr. Geraldo Pitter first. He wishes to discuss the CT with him on his next follow up instead 01/07/2019.

## 2018-12-28 NOTE — Telephone Encounter (Signed)
Left message for patient to return call regarding results  

## 2019-01-01 ENCOUNTER — Other Ambulatory Visit: Payer: Self-pay

## 2019-01-01 ENCOUNTER — Ambulatory Visit (INDEPENDENT_AMBULATORY_CARE_PROVIDER_SITE_OTHER): Payer: BC Managed Care – PPO

## 2019-01-01 DIAGNOSIS — I480 Paroxysmal atrial fibrillation: Secondary | ICD-10-CM

## 2019-01-01 MED ORDER — REGADENOSON 0.4 MG/5ML IV SOLN
0.4000 mg | Freq: Once | INTRAVENOUS | Status: AC
  Administered 2019-01-01: 0.4 mg via INTRAVENOUS

## 2019-01-01 MED ORDER — TECHNETIUM TC 99M TETROFOSMIN IV KIT
30.9000 | PACK | Freq: Once | INTRAVENOUS | Status: AC | PRN
  Administered 2019-01-01: 30.9 via INTRAVENOUS

## 2019-01-02 ENCOUNTER — Ambulatory Visit: Payer: BC Managed Care – PPO

## 2019-01-02 LAB — MYOCARDIAL PERFUSION IMAGING
LV dias vol: 114 mL (ref 62–150)
LV sys vol: 33 mL
Peak HR: 68 {beats}/min
Rest HR: 59 {beats}/min
SDS: 2
SRS: 3
SSS: 5
TID: 0.83

## 2019-01-02 MED ORDER — TECHNETIUM TC 99M TETROFOSMIN IV KIT
32.4000 | PACK | Freq: Once | INTRAVENOUS | Status: AC | PRN
  Administered 2019-01-02: 32.4 via INTRAVENOUS

## 2019-01-03 ENCOUNTER — Telehealth: Payer: Self-pay

## 2019-01-03 NOTE — Telephone Encounter (Signed)
Results relayed, copy sent to Dr. Lisbeth Ply per DR. RRR

## 2019-01-03 NOTE — Telephone Encounter (Signed)
-----   Message from Jenean Lindau, MD sent at 01/03/2019 10:37 AM EDT ----- The results of the study is unremarkable. Please inform patient. I will discuss in detail at next appointment. Cc  primary care/referring physician Jenean Lindau, MD 01/03/2019 10:37 AM

## 2019-01-07 ENCOUNTER — Encounter: Payer: Self-pay | Admitting: Cardiology

## 2019-01-07 ENCOUNTER — Other Ambulatory Visit: Payer: Self-pay

## 2019-01-07 ENCOUNTER — Ambulatory Visit (INDEPENDENT_AMBULATORY_CARE_PROVIDER_SITE_OTHER): Payer: BC Managed Care – PPO | Admitting: Cardiology

## 2019-01-07 VITALS — BP 132/84 | HR 54 | Ht 76.0 in | Wt 292.0 lb

## 2019-01-07 DIAGNOSIS — I77810 Thoracic aortic ectasia: Secondary | ICD-10-CM

## 2019-01-07 DIAGNOSIS — G4733 Obstructive sleep apnea (adult) (pediatric): Secondary | ICD-10-CM | POA: Diagnosis not present

## 2019-01-07 DIAGNOSIS — E785 Hyperlipidemia, unspecified: Secondary | ICD-10-CM

## 2019-01-07 DIAGNOSIS — Z79899 Other long term (current) drug therapy: Secondary | ICD-10-CM | POA: Diagnosis not present

## 2019-01-07 DIAGNOSIS — I10 Essential (primary) hypertension: Secondary | ICD-10-CM

## 2019-01-07 DIAGNOSIS — I480 Paroxysmal atrial fibrillation: Secondary | ICD-10-CM

## 2019-01-07 HISTORY — DX: Thoracic aortic ectasia: I77.810

## 2019-01-07 MED ORDER — PRAVASTATIN SODIUM 10 MG PO TABS: 10.0000 mg | ORAL_TABLET | Freq: Every day | ORAL | 1 refills | Status: DC

## 2019-01-07 MED ORDER — TRIAMTERENE-HCTZ 37.5-25 MG PO TABS: 1.0000 | ORAL_TABLET | Freq: Every day | ORAL | 1 refills | Status: DC

## 2019-01-07 MED ORDER — FLECAINIDE ACETATE 150 MG PO TABS: 150.0000 mg | ORAL_TABLET | Freq: Two times a day (BID) | ORAL | 1 refills | Status: DC

## 2019-01-07 MED ORDER — METOPROLOL SUCCINATE ER 25 MG PO TB24: 25.0000 mg | ORAL_TABLET | Freq: Every day | ORAL | 1 refills | Status: DC

## 2019-01-07 NOTE — Patient Instructions (Signed)
Medication Instructions:  Your physician recommends that you continue on your current medications as directed. Please refer to the Current Medication list given to you today.  *If you need a refill on your cardiac medications before your next appointment, please call your pharmacy*  Lab Work: Your physician recommends that you have a BMP drawn 7 days prior to your procedure If you have labs (blood work) drawn today and your tests are completely normal, you will receive your results only by:  MyChart Message (if you have MyChart) OR  A paper copy in the mail If you have any lab test that is abnormal or we need to change your treatment, we will call you to review the results.  Testing/Procedures: You had an EKG performed today  Non-Cardiac CT scanning, (CAT scanning), is a noninvasive, special x-ray that produces cross-sectional images of the body using x-rays and a computer. CT scans help physicians diagnose and treat medical conditions. For some CT exams, a contrast material is used to enhance visibility in the area of the body being studied. CT scans provide greater clarity and reveal more details than regular x-ray exams.  Follow-Up: At [facility], you and your health needs are our priority.  As part of our continuing mission to provide you with exceptional heart care, we have created designated Provider Care Teams.  These Care Teams include your primary Cardiologist (physician) and Advanced Practice Providers (APPs -  Physician Assistants and Nurse Practitioners) who all work together to provide you with the care you need, when you need it.  Your next appointment:   6 months  The format for your next appointment:   In Person  Provider:   Belva Crome, MD  Other Instructions  CT Angiogram  A CT angiogram is a procedure to look at the blood vessels in various areas of the body. For this procedure, a large X-ray machine, called a CT scanner, takes detailed pictures of blood  vessels that have been injected with a dye (contrast material). A CT angiogram allows your health care provider to see how well blood is flowing to the area of your body that is being checked. Your health care provider will be able to see if there are any problems, such as a blockage. Tell a health care provider about:  Any allergies you have.  All medicines you are taking, including vitamins, herbs, eye drops, creams, and over-the-counter medicines.  Any problems you or family members have had with anesthetic medicines.  Any blood disorders you have.  Any surgeries you have had.  Any medical conditions you have.  Whether you are pregnant or may be pregnant.  Whether you are breastfeeding.  Any anxiety disorders, chronic pain, or other conditions you have that may increase your stress or prevent you from lying still. What are the risks? Generally, this is a safe procedure. However, problems may occur, including:  Infection.  Bleeding.  Allergic reactions to medicines or dyes.  Damage to other structures or organs.  Kidney damage from the dye or contrast that is used.  Increased risk of cancer from radiation exposure. This risk is low. Talk with your health care provider about: ? The risks and benefits of testing. ? How you can receive the lowest dose of radiation. What happens before the procedure?  Wear comfortable clothing and remove any jewelry.  Follow instructions from your health care provider about eating and drinking. For most people, instructions may include these actions: ? For 12 hours before the test, avoid  caffeine. This includes tea, coffee, soda, and energy drinks or pills. ? For 3-4 hours before the test, stop eating or drinking anything but water. ? Stay well hydrated by continuing to drink water before the exam. This will help to clear the contrast dye from your body after the test.  Ask your health care provider about changing or stopping your regular  medicines. This is especially important if you are taking diabetes medicines or blood thinners. What happens during the procedure?  An IV tube will be inserted into one of your veins.  You will be asked to lie on an exam table. This table will slide in and out of the CT machine during the procedure.  Contrast dye will be injected into the IV tube. You might feel warm, or you may get a metallic taste in your mouth.  The table that you are lying on will move into the CT machine tunnel for the scan.  The person running the machine will give you instructions while the scans are being done. You may be asked to: ? Keep your arms above your head. ? Hold your breath. ? Stay very still, even if the table is moving.  When the scanning is complete, you will be moved out of the machine.  The IV tube will be removed. The procedure may vary among health care providers and hospitals. What happens after the procedure?  You might feel warm, or you may get a metallic taste in your mouth.  You may be asked to drink water or other fluids to wash (flush) the contrast material out of your body.  It is up to you to get the results of your procedure. Ask your health care provider, or the department that is doing the procedure, when your results will be ready. Summary  A CT angiogram is a procedure to look at the blood vessels in various areas of the body.  You will need to stay very still during the exam.  You may be asked to drink water or other fluids to wash (flush) the contrast material out of your body after your scan. This information is not intended to replace advice given to you by your health care provider. Make sure you discuss any questions you have with your health care provider. Document Released: 10/29/2015 Document Revised: 05/10/2018 Document Reviewed: 10/29/2015 Elsevier Patient Education  2020 [employer].

## 2019-01-07 NOTE — Progress Notes (Addendum)
Cardiology Office Note:    Date:  01/07/2019   ID:  Jon Ortiz, DOB February 28, 1956, MRN [id number]  PCP:  Leonides Sake, MD  Cardiologist:  Jenean Lindau, MD   Referring MD: Leonides Sake, MD    ASSESSMENT:    1. Paroxysmal atrial fibrillation (HCC)   2. Essential hypertension   3. OSA (obstructive sleep apnea)   4. High risk medication use   5. Dyslipidemia   6. Ascending aorta dilatation (HCC)    PLAN:    In order of problems listed above:  1. Paroxysmal atrial fibrillation:I discussed with the patient atrial fibrillation, disease process. Management and therapy including rate and rhythm control, anticoagulation benefits and potential risks were discussed extensively with the patient. Patient had multiple questions which were answered to patient's satisfaction.  Patient's she had score is 1 so he is not on any anticoagulation.  2.  Essential hypertension: Blood pressure is stable 3. Ascending aortic dilatation: Patient will undergo CT scanning with contrast.  We will schedule this to evaluate the above finding on echocardiogram. 4. Patient will be seen in follow-up appointment in 6 months or earlier if the patient has any concerns    Medication Adjustments/Labs and Tests Ordered: Current medicines are reviewed at length with the patient today.  Concerns regarding medicines are outlined above.  No orders of the defined types were placed in this encounter.  No orders of the defined types were placed in this encounter.    No chief complaint on file.    History of Present Illness:    Jon Ortiz is a 63 y.o. male.  Patient has past medical history approximately fibrillation, essential hypertension and dyslipidemia.  He is overweight.  He mentions to me that with increasing flecainide he is rhythm is better and he does not feel any palpitations.  No chest pain orthopnea or PND.  At the time of my evaluation, the patient is alert awake  oriented and in no distress.  Past Medical History:  Diagnosis Date  . A-fib ([place])   . Hyperlipidemia   . Hypertension     History reviewed. No pertinent surgical history.  Current Medications: Current Meds  Medication Sig  . aspirin 325 MG tablet Take 1 tablet by mouth daily.  . cetirizine (ZYRTEC) 10 MG tablet Take 10 mg by mouth daily.  . ciclopirox (LOPROX) 0.77 % SUSP   . Co-Enzyme Q-10 30 MG CAPS Take 200 mg by mouth daily.   . flecainide (TAMBOCOR) 150 MG tablet Take 1 tablet (150 mg total) by mouth 2 (two) times daily.  Marland Kitchen ketoconazole (NIZORAL) 2 % shampoo   . metoprolol succinate (TOPROL-XL) 25 MG 24 hr tablet TAKE 1 TABLET BY MOUTH ONCE DAILY.  . Multiple Vitamin (MULTI-VITAMINS) TABS Take 3 tablets by mouth daily.   . pravastatin (PRAVACHOL) 10 MG tablet TAKE 1 TABLET BY MOUTH ONCE DAILY.  Marland Kitchen RA KRILL OIL 500 MG CAPS Take 1 capsule by mouth daily.  Marland Kitchen triamterene-hydrochlorothiazide (MAXZIDE-25) 37.5-25 MG tablet TAKE 1 TABLET BY MOUTH ONCE DAILY.     Allergies:   Patient has no known allergies.   Social History   Socioeconomic History  . Marital status: Married    Spouse name: Not on file  . Number of children: Not on file  . Years of education: Not on file  . Highest education level: Not on file  Occupational History  . Not on file  Social Needs  . Financial resource strain: Not  on file  . Food insecurity    Worry: Not on file    Inability: Not on file  . Transportation needs    Medical: Not on file    Non-medical: Not on file  Tobacco Use  . Smoking status: Former Games developermoker  . Smokeless tobacco: Never Used  Substance and Sexual Activity  . Alcohol use: No    Frequency: Never  . Drug use: No  . Sexual activity: Not on file  Lifestyle  . Physical activity    Days per week: Not on file    Minutes per session: Not on file  . Stress: Not on file  Relationships  . Social Musicianconnections    Talks on phone: Not on file    Gets together: Not on file     Attends religious service: Not on file    Active member of club or organization: Not on file    Attends meetings of clubs or organizations: Not on file    Relationship status: Not on file  Other Topics Concern  . Not on file  Social History Narrative  . Not on file     Family History: The patient's family history includes Arrhythmia in his father; Heart disease in his father; Valvular heart disease in his mother.  ROS:   Please see the history of present illness.    All other systems reviewed and are negative.  EKGs/Labs/Other Studies Reviewed:    EKG reveals sinus rhythm first-degree AV block and nonspecific ST-T changes.  The following studies were reviewed today: Study Highlights     The left ventricular ejection fraction is hyperdynamic (>65%).  Nuclear stress EF: 71%.  There was no ST segment deviation noted during stress.  No T wave inversion was noted during stress.  Defect 1: There is a small defect of mild severity present in the basal anteroseptal location.  The study is normal.  This is a low risk study.   The mild small fixed perfusion defect in the basal anteroseptal with normal wall motion is likely soft tissue attenuation.    IMPRESSIONS    1. Left ventricular ejection fraction, by visual estimation, is 60 to 65%. The left ventricle has normal function. Normal left ventricular size. There is no left ventricular hypertrophy.  2. Left ventricular diastolic Doppler parameters are consistent with pseudonormalization pattern of LV diastolic filling.  3. Global right ventricle has normal systolic function.The right ventricular size is normal. No increase in right ventricular wall thickness.  4. Left atrial size was normal.  5. There is moderate dilatation of the ascending aorta measuring 42 mm.   Recent Labs: No results found for requested labs within last 8760 hours.  Recent Lipid Panel No results found for: CHOL, TRIG, HDL, CHOLHDL, VLDL,  LDLCALC, LDLDIRECT  Physical Exam:    VS:  BP 132/84 (BP Location: Left Arm, Patient Position: Standing, Cuff Size: Normal)   Pulse (!) 54   Ht 6\' 4"  (1.93 m)   Wt 292 lb (132.5 kg)   SpO2 96%   BMI 35.54 kg/m     Wt Readings from Last 3 Encounters:  01/07/19 292 lb (132.5 kg)  01/01/19 287 lb (130.2 kg)  12/14/18 287 lb (130.2 kg)     GEN: Patient is in no acute distress HEENT: Normal NECK: No JVD; No carotid bruits LYMPHATICS: No lymphadenopathy CARDIAC: Hear sounds regular, 2/6 systolic murmur at the apex. RESPIRATORY:  Clear to auscultation without rales, wheezing or rhonchi  ABDOMEN: Soft, non-tender, non-distended MUSCULOSKELETAL:  No edema; No deformity  SKIN: Warm and dry NEUROLOGIC:  Alert and oriented x 3 PSYCHIATRIC:  Normal affect   Signed, Garwin Brothers, MD  01/07/2019 10:02 AM    [facility]

## 2019-01-07 NOTE — Addendum Note (Signed)
Addended by: Beckey Rutter on: 01/07/2019 10:24 AM   Modules accepted: Orders

## 2019-02-11 ENCOUNTER — Encounter (HOSPITAL_BASED_OUTPATIENT_CLINIC_OR_DEPARTMENT_OTHER): Payer: Self-pay

## 2019-02-11 ENCOUNTER — Other Ambulatory Visit: Payer: Self-pay

## 2019-02-11 ENCOUNTER — Ambulatory Visit (HOSPITAL_BASED_OUTPATIENT_CLINIC_OR_DEPARTMENT_OTHER)
Admission: RE | Admit: 2019-02-11 | Discharge: 2019-02-11 | Disposition: A | Discharge status: Home / Self Care | Payer: BC Managed Care – PPO | Source: Ambulatory Visit | Attending: Cardiology | Admitting: Cardiology

## 2019-02-11 DIAGNOSIS — I77810 Thoracic aortic ectasia: Secondary | ICD-10-CM | POA: Insufficient documentation

## 2019-02-11 MED ORDER — IOHEXOL 350 MG/ML SOLN
100.0000 mL | Freq: Once | INTRAVENOUS | Status: AC | PRN
  Administered 2019-02-11: 100 mL via INTRAVENOUS

## 2019-02-20 ENCOUNTER — Telehealth: Payer: Self-pay

## 2019-02-20 DIAGNOSIS — E785 Hyperlipidemia, unspecified: Secondary | ICD-10-CM

## 2019-02-20 NOTE — Telephone Encounter (Signed)
-----   Message from Jenean Lindau, MD sent at 02/11/2019  8:57 AM EST ----- Mildly dilated ascending aorta.  Please bring him for liver lipid check.  We need to start him on statin therapy eventually if he is not on statins.  Also tell him about the noncardiac findings and that they will be followed by his primary care physician.  Send a copy to primary care and talk to the nurse. Jenean Lindau, MD 02/11/2019 8:57 AM

## 2019-02-20 NOTE — Telephone Encounter (Signed)
Left message for patient to call back for results. Copy sent to Dr. Lisbeth Ply. Patient is already on COQ10 and pravastatin 10 mg. Repeat labs ordered. Note routed back to RRR

## 2019-02-22 ENCOUNTER — Telehealth: Payer: Self-pay | Admitting: Cardiology

## 2019-02-22 NOTE — Telephone Encounter (Signed)
Patient would like a call back regarding test results. 

## 2019-02-25 NOTE — Telephone Encounter (Signed)
Left message for patient to return call.

## 2019-02-26 NOTE — Telephone Encounter (Signed)
Patient called back in he was informed of CT results from below. Patient is already on statin therapy and just had labs drawn at pcp office they are in media. Copy of ct sent to pcp, patient will follow up with them for noncardiac results if needed. No further questions.    Jenean Lindau, MD  02/11/2019 8:57 AM EST    Mildly dilated ascending aorta. Please bring him for liver lipid check. We need to start him on statin therapy eventually if he is not on statins. Also tell him about the noncardiac findings and that they will be followed by his primary care physician. Send a copy to primary care and talk to the nurse. Jenean Lindau, MD 02/11/2019 8:57 AM

## 2019-04-10 ENCOUNTER — Other Ambulatory Visit: Payer: Self-pay | Admitting: Cardiology

## 2019-05-11 ENCOUNTER — Other Ambulatory Visit: Payer: Self-pay | Admitting: Cardiology

## 2019-07-11 ENCOUNTER — Encounter: Payer: Self-pay | Admitting: Cardiology

## 2019-07-11 ENCOUNTER — Ambulatory Visit (INDEPENDENT_AMBULATORY_CARE_PROVIDER_SITE_OTHER): Payer: BC Managed Care – PPO | Admitting: Cardiology

## 2019-07-11 ENCOUNTER — Other Ambulatory Visit: Payer: Self-pay

## 2019-07-11 VITALS — BP 160/82 | HR 70 | Temp 97.0°F | Ht 76.0 in | Wt 294.6 lb

## 2019-07-11 DIAGNOSIS — I480 Paroxysmal atrial fibrillation: Secondary | ICD-10-CM | POA: Diagnosis not present

## 2019-07-11 DIAGNOSIS — I10 Essential (primary) hypertension: Secondary | ICD-10-CM | POA: Diagnosis not present

## 2019-07-11 DIAGNOSIS — I77810 Thoracic aortic ectasia: Secondary | ICD-10-CM

## 2019-07-11 DIAGNOSIS — E785 Hyperlipidemia, unspecified: Secondary | ICD-10-CM

## 2019-07-11 DIAGNOSIS — N289 Disorder of kidney and ureter, unspecified: Secondary | ICD-10-CM

## 2019-07-11 MED ORDER — CO-ENZYME Q-10 30 MG PO CAPS: 200.0000 mg | ORAL_CAPSULE | Freq: Every day | ORAL | 3 refills | Status: DC

## 2019-07-11 MED ORDER — TRIAMTERENE-HCTZ 37.5-25 MG PO TABS: 1.0000 | ORAL_TABLET | Freq: Every day | ORAL | 3 refills | Status: DC

## 2019-07-11 MED ORDER — PRAVASTATIN SODIUM 10 MG PO TABS: 10.0000 mg | ORAL_TABLET | Freq: Every day | ORAL | 3 refills | Status: DC

## 2019-07-11 MED ORDER — FLECAINIDE ACETATE 150 MG PO TABS: ORAL_TABLET | ORAL | 3 refills | Status: DC

## 2019-07-11 MED ORDER — METOPROLOL SUCCINATE ER 25 MG PO TB24: 25.0000 mg | ORAL_TABLET | Freq: Every day | ORAL | 3 refills | Status: DC

## 2019-07-11 MED ORDER — ASPIRIN EC 81 MG PO TBEC: 81.0000 mg | DELAYED_RELEASE_TABLET | Freq: Every day | ORAL | 3 refills | Status: AC

## 2019-07-11 NOTE — Patient Instructions (Signed)
Medication Instructions:  Your physician has recommended you make the following change in your medication:   Take 81 mg Aspirin daily instead of 325 mg.  *If you need a refill on your cardiac medications before your next appointment, please call your pharmacy*   Lab Work: You had a BMET today in the office. If you have labs (blood work) drawn today and your tests are completely normal, you will receive your results only by: Marland Kitchen MyChart Message (if you have MyChart) OR . A paper copy in the mail If you have any lab test that is abnormal or we need to change your treatment, we will call you to review the results.   Testing/Procedures: None ordered   Follow-Up: At [facility], you and your health needs are our priority.  As part of our continuing mission to provide you with exceptional heart care, we have created designated Provider Care Teams.  These Care Teams include your primary Cardiologist (physician) and Advanced Practice Providers (APPs -  Physician Assistants and Nurse Practitioners) who all work together to provide you with the care you need, when you need it.  We recommend signing up for the patient portal called "MyChart".  Sign up information is provided on this After Visit Summary.  MyChart is used to connect with patients for Virtual Visits (Telemedicine).  Patients are able to view lab/test results, encounter notes, upcoming appointments, etc.  Non-urgent messages can be sent to your provider as well.   To learn more about what you can do with MyChart, go to [email or web address].    Your next appointment:   6 month(s)  The format for your next appointment:   In Person  Provider:   Belva Crome, MD   Other Instructions NA

## 2019-07-11 NOTE — Progress Notes (Signed)
Cardiology Office Note:    Date:  07/11/2019   ID:  Jon Ortiz, DOB [birth year], MRN [id number]  PCP:  Leonides Sake, MD  Cardiologist:  Jenean Lindau, MD   Referring MD: Leonides Sake, MD    ASSESSMENT:    1. Paroxysmal atrial fibrillation (HCC)   2. Dyslipidemia   3. Essential hypertension   4. Ascending aorta dilatation (HCC)    PLAN:    In order of problems listed above:  1. Primary prevention stressed with the patient.  Importance of compliance with diet medication stressed and he vocalized understanding diet was discussed for losing weight and weight reduction was stressed 2. Essential hypertension: Blood pressure stable he feels it might be mildly elevated is not sure about it.  He will check his readings twice a week and send it to [place] in the mail and we will review it and appropriately titrate medications.  Diet including salt intake issues were emphasized 3. Aortic ascending dilatation: Stable and we will review this when we see him in follow-up appointment in 6 months.  At that time we will do a CT scan. 4. Mixed dyslipidemia: Lipids followed by primary care physician and patient on statin therapy.  Keeping sheet lipids are fine.Patient will be seen in follow-up appointment in 6 months or earlier if the patient has any concerns  5.    Medication Adjustments/Labs and Tests Ordered: Current medicines are reviewed at length with the patient today.  Concerns regarding medicines are outlined above.  No orders of the defined types were placed in this encounter.  No orders of the defined types were placed in this encounter.    No chief complaint on file.    History of Present Illness:    Jon Ortiz is a 64 y.o. male.  Patient has past medical history of paroxysmal atrial fibrillation essential hypertension dyslipidemia and obesity.  He has ascending aorta dilation.  He denies any problems at this time and takes care of activities  of daily living.  No chest pain orthopnea or PND.  He is tolerating flecainide fine.  He leads a sedentary lifestyle.  At the time of my evaluation, the patient is alert awake oriented and in no distress.  Past Medical History:  Diagnosis Date  . A-fib ([place])   . Ascending aorta dilatation (HCC) 01/07/2019  . Dyslipidemia 03/29/2017  . Essential hypertension 03/29/2017  . High risk medication use 12/12/2018  . Hyperlipidemia   . Hypertension   . OSA (obstructive sleep apnea) 05/28/2015  . Paroxysmal atrial fibrillation ([place]) 01/05/2015    History reviewed. No pertinent surgical history.  Current Medications: Current Meds  Medication Sig  . aspirin 325 MG tablet Take 1 tablet by mouth daily.  . cetirizine (ZYRTEC) 10 MG tablet Take 10 mg by mouth daily.  . ciclopirox (LOPROX) 0.77 % SUSP   . Co-Enzyme Q-10 30 MG CAPS Take 200 mg by mouth daily.   . flecainide (TAMBOCOR) 150 MG tablet TAKE 1 TABLET BY MOUTH TWICE(2) DAILY  . ketoconazole (NIZORAL) 2 % shampoo   . metoprolol succinate (TOPROL-XL) 25 MG 24 hr tablet Take 1 tablet (25 mg total) by mouth daily.  . Multiple Vitamin (MULTI-VITAMINS) TABS Take 3 tablets by mouth daily.   . pravastatin (PRAVACHOL) 10 MG tablet TAKE 1 TABLET BY MOUTH ONCE DAILY  . RA KRILL OIL 500 MG CAPS Take 1 capsule by mouth daily.  Marland Kitchen triamterene-hydrochlorothiazide (MAXZIDE-25) 37.5-25 MG tablet Take 1 tablet by  mouth daily.     Allergies:   Patient has no known allergies.   Social History   Socioeconomic History  . Marital status: Married    Spouse name: Not on file  . Number of children: Not on file  . Years of education: Not on file  . Highest education level: Not on file  Occupational History  . Not on file  Tobacco Use  . Smoking status: Former Games developer  . Smokeless tobacco: Never Used  Substance and Sexual Activity  . Alcohol use: No  . Drug use: No  . Sexual activity: Not on file  Other Topics Concern  . Not on file  Social History  Narrative  . Not on file   Social Determinants of Health   Financial Resource Strain:   . Difficulty of Paying Living Expenses:   Food Insecurity:   . Worried About Programme researcher, broadcasting/film/video in the Last Year:   . Barista in the Last Year:   Transportation Needs:   . Freight forwarder (Medical):   Marland Kitchen Lack of Transportation (Non-Medical):   Physical Activity:   . Days of Exercise per Week:   . Minutes of Exercise per Session:   Stress:   . Feeling of Stress :   Social Connections:   . Frequency of Communication with Friends and Family:   . Frequency of Social Gatherings with Friends and Family:   . Attends Religious Services:   . Active Member of Clubs or Organizations:   . Attends Banker Meetings:   Marland Kitchen Marital Status:      Family History: The patient's family history includes Arrhythmia in his father; Heart disease in his father; Valvular heart disease in his mother.  ROS:   Please see the history of present illness.    All other systems reviewed and are negative.  EKGs/Labs/Other Studies Reviewed:    The following studies were reviewed today: EKG was sinus rhythm first-degree AV block and nonspecific ST-T changes   Recent Labs: No results found for requested labs within last 8760 hours.  Recent Lipid Panel No results found for: CHOL, TRIG, HDL, CHOLHDL, VLDL, LDLCALC, LDLDIRECT  Physical Exam:    VS:  BP (!) 160/82   Pulse 70   Temp (!) 97 F (36.1 C)   Ht 6\' 4"  (1.93 m)   Wt 294 lb 9.6 oz (133.6 kg)   SpO2 98%   BMI 35.86 kg/m     Wt Readings from Last 3 Encounters:  07/11/19 294 lb 9.6 oz (133.6 kg)  01/07/19 292 lb (132.5 kg)  01/01/19 287 lb (130.2 kg)     GEN: Patient is in no acute distress HEENT: Normal NECK: No JVD; No carotid bruits LYMPHATICS: No lymphadenopathy CARDIAC: Hear sounds regular, 2/6 systolic murmur at the apex. RESPIRATORY:  Clear to auscultation without rales, wheezing or rhonchi  ABDOMEN: Soft,  non-tender, non-distended MUSCULOSKELETAL:  No edema; No deformity  SKIN: Warm and dry NEUROLOGIC:  Alert and oriented x 3 PSYCHIATRIC:  Normal affect   Signed, 01/03/19, MD  07/11/2019 4:04 PM    [facility]

## 2019-07-12 ENCOUNTER — Other Ambulatory Visit: Payer: Self-pay

## 2019-07-12 DIAGNOSIS — N289 Disorder of kidney and ureter, unspecified: Secondary | ICD-10-CM

## 2019-07-12 LAB — BASIC METABOLIC PANEL
BUN/Creatinine Ratio: 18 (ref 10–24)
BUN: 27 mg/dL (ref 8–27)
CO2: 23 mmol/L (ref 20–29)
Calcium: 9.4 mg/dL (ref 8.6–10.2)
Chloride: 105 mmol/L (ref 96–106)
Creatinine, Ser: 1.47 mg/dL — ABNORMAL HIGH (ref 0.76–1.27)
GFR calc Af Amer: 58 mL/min/{1.73_m2} — ABNORMAL LOW (ref >59)
GFR calc non Af Amer: 50 mL/min/{1.73_m2} — ABNORMAL LOW (ref >59)
Glucose: 103 mg/dL — ABNORMAL HIGH (ref 65–99)
Potassium: 3.8 mmol/L (ref 3.5–5.2)
Sodium: 142 mmol/L (ref 134–144)

## 2019-07-12 NOTE — Addendum Note (Signed)
Addended by: Eleonore Chiquito on: 07/12/2019 01:14 PM   Modules accepted: Orders

## 2019-07-19 LAB — BASIC METABOLIC PANEL
BUN/Creatinine Ratio: 15 (ref 10–24)
BUN: 20 mg/dL (ref 8–27)
CO2: 22 mmol/L (ref 20–29)
Calcium: 9.6 mg/dL (ref 8.6–10.2)
Chloride: 104 mmol/L (ref 96–106)
Creatinine, Ser: 1.36 mg/dL — ABNORMAL HIGH (ref 0.76–1.27)
GFR calc Af Amer: 64 mL/min/{1.73_m2} (ref >59)
GFR calc non Af Amer: 55 mL/min/{1.73_m2} — ABNORMAL LOW (ref >59)
Glucose: 105 mg/dL — ABNORMAL HIGH (ref 65–99)
Potassium: 4 mmol/L (ref 3.5–5.2)
Sodium: 143 mmol/L (ref 134–144)

## 2019-07-30 ENCOUNTER — Telehealth: Payer: Self-pay | Admitting: Cardiology

## 2019-07-30 NOTE — Telephone Encounter (Signed)
Results sent to Dr. Nathanial Rancher as requested by pt. Pt is aware.

## 2019-07-30 NOTE — Telephone Encounter (Signed)
° °  Pt is calling requesting if Dr Tomie China can send copy of his 2 blood work done on 04/26 and 05/06 to his PCP Dr. Sharman Crate Hamrick  Please advise

## 2020-06-16 ENCOUNTER — Ambulatory Visit (INDEPENDENT_AMBULATORY_CARE_PROVIDER_SITE_OTHER): Payer: BC Managed Care – PPO | Admitting: Podiatry

## 2020-06-16 ENCOUNTER — Other Ambulatory Visit: Payer: Self-pay

## 2020-06-16 DIAGNOSIS — M79676 Pain in unspecified toe(s): Secondary | ICD-10-CM

## 2020-06-16 DIAGNOSIS — L600 Ingrowing nail: Secondary | ICD-10-CM

## 2020-06-16 NOTE — Patient Instructions (Signed)

## 2020-06-27 ENCOUNTER — Other Ambulatory Visit: Payer: Self-pay | Admitting: Cardiology

## 2020-06-28 IMAGING — CT CT ANGIO CHEST
2 of 6 series · 16 of 46 positions shown · IV contrast (omnipaque)
Comparison: Report of echocardiogram December 27, 2018.

CLINICAL DATA: Ascending thoracic aortic prominence noted on recent
echocardiogram

EXAM:
CT ANGIOGRAPHY CHEST WITH CONTRAST
TECHNIQUE: Multidetector CT imaging of the chest was performed using the
standard protocol during bolus administration of intravenous
contrast. Multiplanar CT image reconstructions and MIPs were
obtained to evaluate the vascular anatomy.
CONTRAST:  100mL OMNIPAQUE IOHEXOL 350 MG/ML SOLN

[Series 4: axial arterial · axial · arterial · 0.98mm/px · z∈[-332,-53]mm · 13 of 105 slices shown]
[im 6/105  lung]
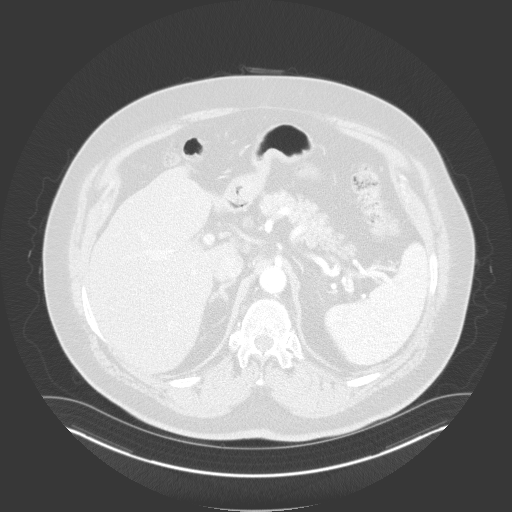
[im 17/105  soft-tissue]
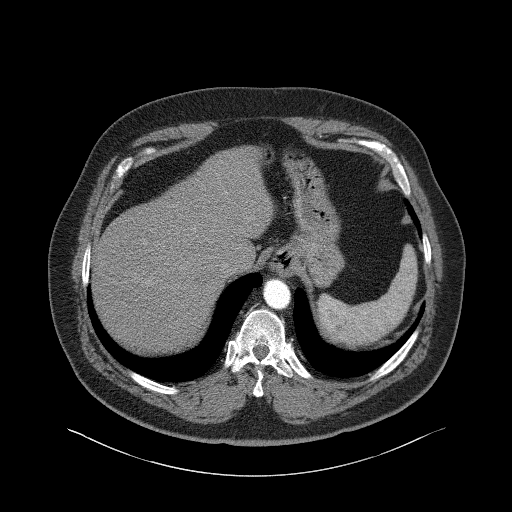
[im 22/105  lung]
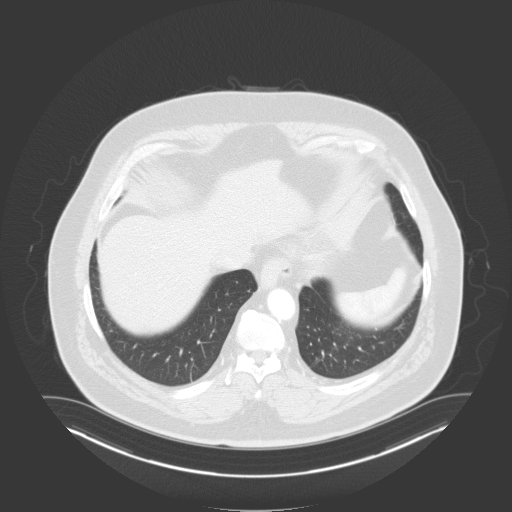
[im 28/105  soft-tissue]
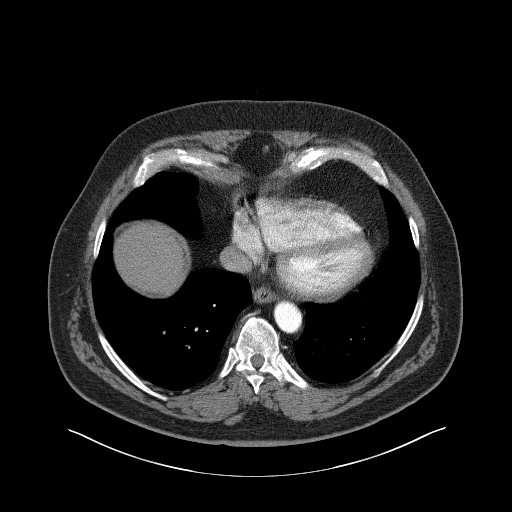
[im 39/105  lung]
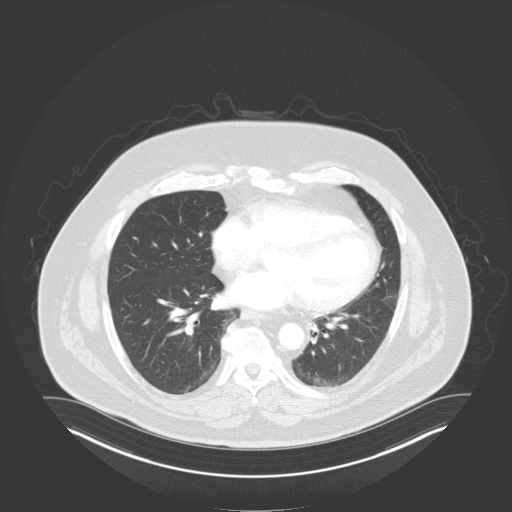
[im 44/105  soft-tissue]
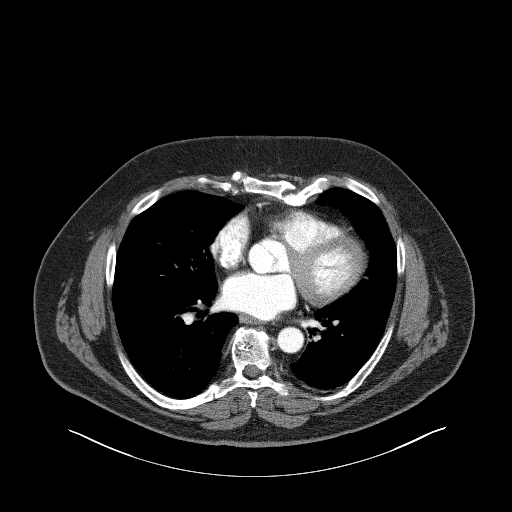
[im 55/105  lung]
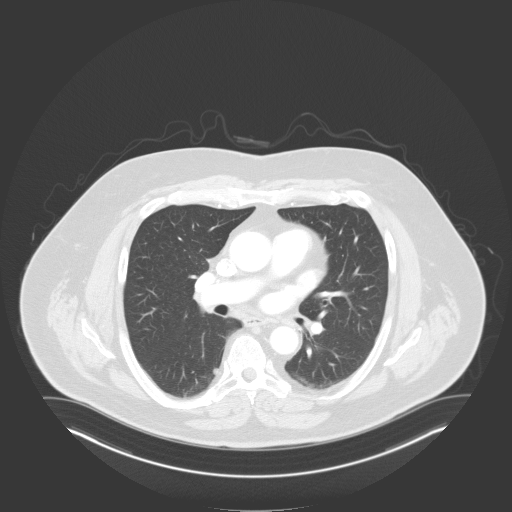
[im 61/105  soft-tissue]
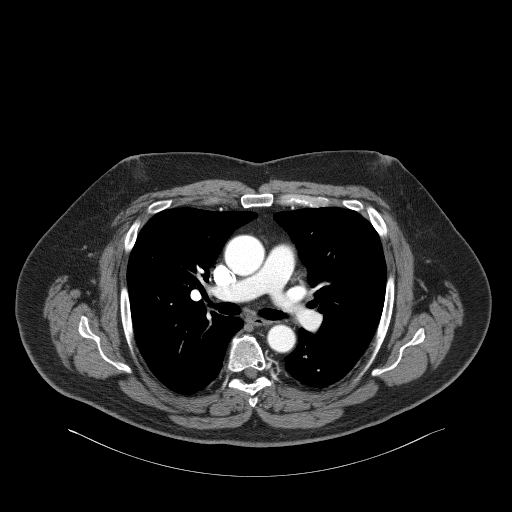
[im 66/105  lung]
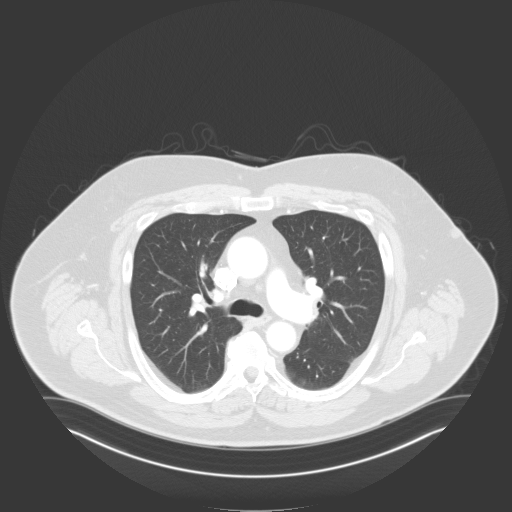
[im 77/105  soft-tissue]
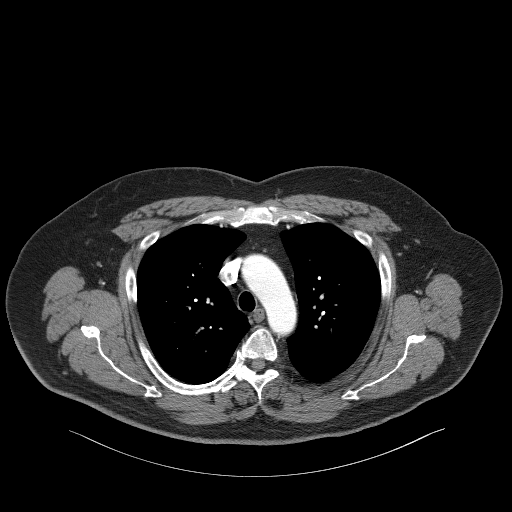
[im 83/105  lung]
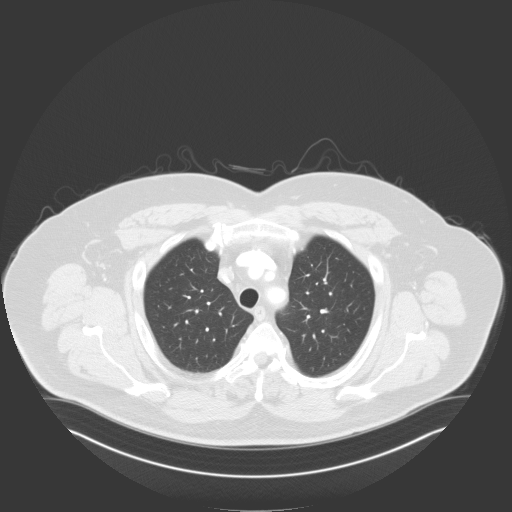
[im 88/105  soft-tissue]
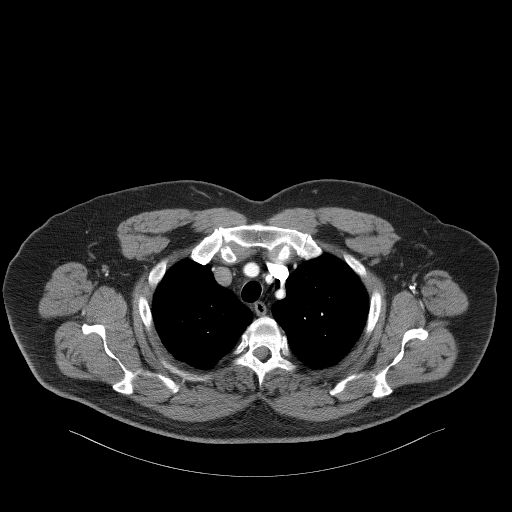
[im 99/105  lung]
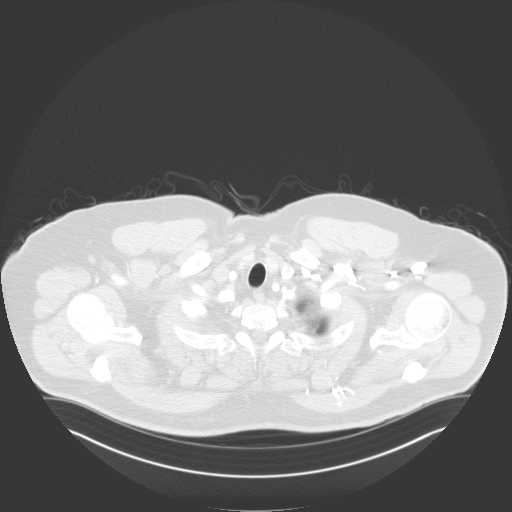

[Series 6: coronal · coronal · 0.64mm/px · 3 of 106 slices shown]
[im 27/106  soft-tissue]
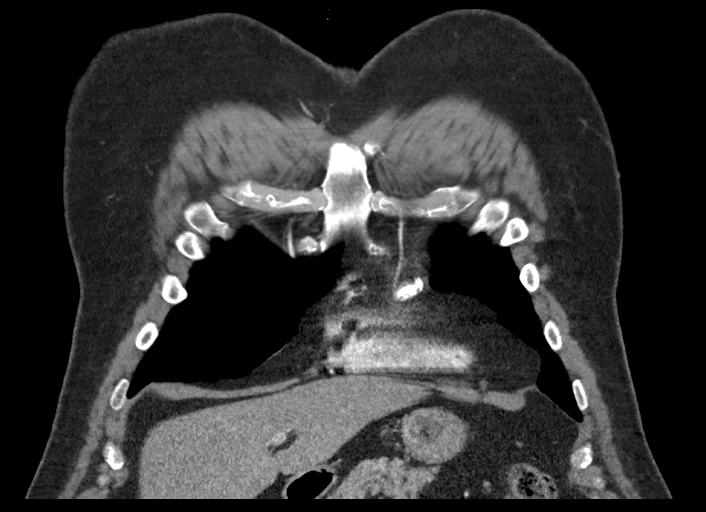
[im 53/106  soft-tissue]
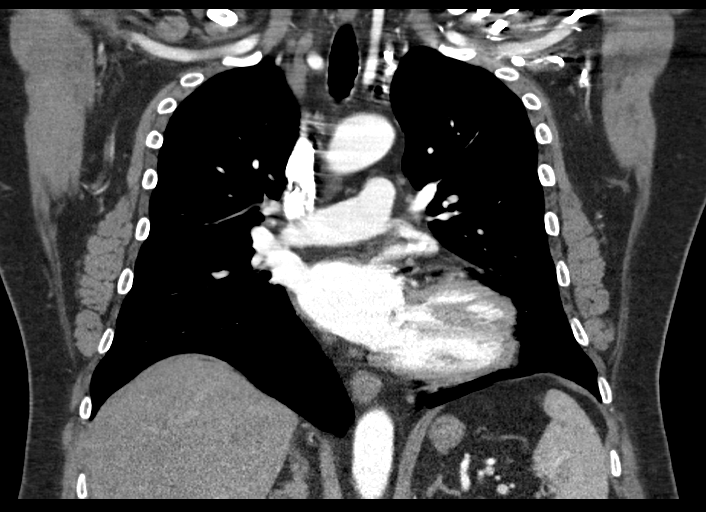
[im 79/106  soft-tissue]
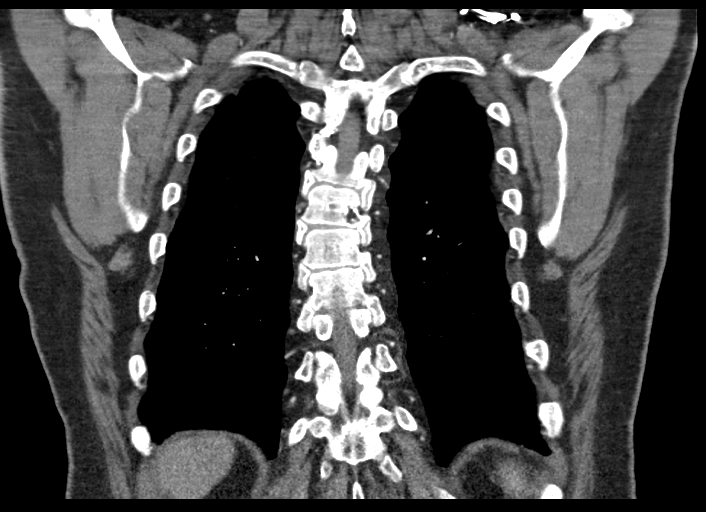

[16 of 46 positions shown; findings below may reference images not displayed]

FINDINGS: Cardiovascular: Ascending thoracic aortic diameter measures 4.0 x
4.0 cm. Measured diameter at the sinuses of Valsalva measures
cm. Measured value of the descending thoracic aorta at the main
pulmonary outflow tract level measures 2.9 x 2.8 cm. There is no
thoracic aortic dissection. Visualized great vessels appear
unremarkable. There is mild aortic atherosclerosis. There is no
pericardial effusion or pericardial thickening. There is no
demonstrable pulmonary embolus.

Mediastinum/Nodes: Visualized thyroid appears unremarkable. There is
no evident thoracic adenopathy. There is a fairly small hiatal
hernia.

Lungs/Pleura: There is slight bibasilar atelectasis. There is no
evident edema or consolidation. On axial slice 45 series 5, there is
a 5 mm nodular opacity in the lateral segment of the left lower
lobe. There is a 5 x 5 mm nodular opacity in the anterior segment of
the left lower lobe seen on axial slice 48 series 5. On axial slice
34 series 5, there is a 2 mm nodular opacity in the lateral segment
right middle lobe. On axial slice 16 series 5, there is a 3 mm
nodular opacity in the posterior segment of the left upper lobe. No
pleural effusions evident.

Upper Abdomen: There is hepatic steatosis. Visualized upper
abdominal structures otherwise appear unremarkable.

Musculoskeletal: There are foci of degenerative change in the
thoracic spine. There is thoracic dextroscoliosis. There is a
hemangioma in the T8 vertebral body. There are no blastic or lytic
bone lesions. No evident chest wall lesions.

Review of the MIP images confirms the above findings.
IMPRESSION: 1. Ascending thoracic aortic diameter measures 4.0 x 4.0 cm. No
evident thoracic aortic dissection. There are foci of aortic
atherosclerosis. Recommend annual imaging followup by CTA or MRA.
This recommendation follows 3303
ACCF/AHA/AATS/ACR/ASA/SCA/ITALOS/KEIKETILE/KAKI/SCHARLL Guidelines for the
Diagnosis and Management of Patients with Thoracic Aortic Disease.
Circulation. 3303; 121: E266-e369. Aortic aneurysm NOS
(62S55-YDC.P).

2.  No demonstrable pulmonary embolus.

3. Areas of slight atelectatic change. No edema or consolidation.
Scattered nodular opacities, largest measuring 5 mm. No follow-up
needed if patient is low-risk (and has no known or suspected primary
neoplasm). Non-contrast chest CT can be considered in 12 months if
patient is high-risk. This recommendation follows the consensus
statement: Guidelines for Management of Incidental Pulmonary Nodules
Detected on CT Images: From the [HOSPITAL] 6621; Radiology
6621; [DATE].

4.  Hiatal hernia.

5.  No evident adenopathy.

6.  Hepatic steatosis.

Aortic Atherosclerosis (62S55-O8T.T).

## 2020-06-29 NOTE — Telephone Encounter (Signed)
Flecainide 150 mg # 180 tablets only, message to patient and pharmacy that patient will need to follow up for further refills

## 2020-07-03 ENCOUNTER — Ambulatory Visit (INDEPENDENT_AMBULATORY_CARE_PROVIDER_SITE_OTHER): Payer: BC Managed Care – PPO | Admitting: Podiatry

## 2020-07-03 ENCOUNTER — Ambulatory Visit (INDEPENDENT_AMBULATORY_CARE_PROVIDER_SITE_OTHER): Payer: BC Managed Care – PPO

## 2020-07-03 ENCOUNTER — Other Ambulatory Visit: Payer: Self-pay

## 2020-07-03 DIAGNOSIS — M79676 Pain in unspecified toe(s): Secondary | ICD-10-CM | POA: Diagnosis not present

## 2020-07-03 DIAGNOSIS — M2041 Other hammer toe(s) (acquired), right foot: Secondary | ICD-10-CM

## 2020-07-03 DIAGNOSIS — L600 Ingrowing nail: Secondary | ICD-10-CM

## 2020-07-16 NOTE — Progress Notes (Signed)
  Subjective:  Patient ID: Jon Ortiz, male    DOB: 1956-02-01,  MRN: [id number]  No chief complaint on file.  65 y.o. male presents with the above complaint. History confirmed with patient.  Reports pain to the right great toenail on the inside.  Has had this ingrown nail for a while.  Reports soreness tenderness and redness with history of drainage.  Objective:  Physical Exam: warm, good capillary refill, no trophic changes or ulcerative lesions, normal DP and PT pulses and normal sensory exam.  Painful ingrowing nail at  medial border of the right, hallux; local warmth noted and local erythema noted  Assessment:   1. Ingrown nail   2. Pain around toenail   . Plan:  Patient was evaluated and treated and all questions answered.  Ingrown Nail, right -Patient elects to proceed with ingrown toenail removal today -Ingrown nail excised. See procedure note. -Educated on post-procedure care including soaking. Written instructions provided. -Patient to follow up in 2 weeks for nail check.  Procedure: Excision of Ingrown Toenail Location: Right 1st toe  medial border Anesthesia: Lidocaine 1% plain; 1.2mL and Marcaine 0.5% plain; 1.68mL, digital block. Skin Prep: Betadine. Dressing: Silvadene; telfa; dry, sterile, compression dressing. Technique: Following skin prep, the toe was exsanguinated and a tourniquet was secured at the base of the toe. The affected nail border was freed, split with a nail splitter, and excised. Chemical matrixectomy was then performed with phenol and irrigated out with alcohol. The tourniquet was then removed and sterile dressing applied. Disposition: Patient tolerated procedure well.

## 2020-07-16 NOTE — Progress Notes (Addendum)
  Subjective:  Patient ID: Jon Ortiz, male    DOB: 1956-01-17,  MRN: [id number]  Chief Complaint  Patient presents with   Nail Problem    Right 1st medial ingrown follow up, pt states no concerns.   Hammer Toe    Right 2nd hammertoe, discuss options for treatment.    65 y.o. male presents for follow up of nail procedure. History confirmed with patient.   Objective:  Physical Exam: Ingrown nail avulsion site: overlying soft crust, no warmth, no drainage and no erythema   Hammertoes right foot with POP 2nd/3rd toe, POP 2nd/3rd metatarsal areas Assessment:   1. Hammertoe of right foot     Plan:  Patient was evaluated and treated and all questions answered.  S/p Ingrown Toenail Excision, right -Healing well without issue. -Discussed return precautions.   Hammertoe -XR reviewed with patient -Educated on etiology of deformity -Discussed padding and shoe gear changes -Discussed with patient that they would benefit from surgical intervention after having failed all conservative therapy. -Patient has failed all conservative therapy and wishes to proceed with surgical intervention. All risks, benefits, and alternatives discussed with patient. No guarantees given. Consent reviewed and signed by patient. -Planned procedures: right foot 2nd/3rd hammertoe correction, 2nd/3rd metatarsal shortening osteotomy

## 2020-08-21 ENCOUNTER — Telehealth: Payer: Self-pay | Admitting: Urology

## 2020-08-21 NOTE — Telephone Encounter (Signed)
DOS - 09/16/20  METATARSAL OSTEO 2,3 RIGHT --- 16109 HAMMERTOE REPAIR 2,3 RIGHT --- 60454   [employer] EFFECTIVE DATE - 02/12/20   PLAN DEDUCTIBLE - $3,000.00 W/ $098.11 REMAINING OUT OF POCKET - $7,000.00 W/ $9,147.82 REMAINING COINSURANCE -20% COPAY - $0.00   NO PRIOR AUTH REQUIRED

## 2020-09-07 ENCOUNTER — Telehealth: Payer: Self-pay | Admitting: Podiatry

## 2020-09-07 NOTE — Telephone Encounter (Signed)
Patient is wondering what he should do about his blood pressure medication/ afib medication before sx. He wants to know if he should take it the day of sx or not.

## 2020-09-15 ENCOUNTER — Other Ambulatory Visit: Payer: Self-pay | Admitting: Podiatry

## 2020-09-15 MED ORDER — OXYCODONE-ACETAMINOPHEN 5-325 MG PO TABS: 1.0000 | ORAL_TABLET | ORAL | 0 refills | Status: DC | PRN

## 2020-09-15 MED ORDER — CEPHALEXIN 500 MG PO CAPS
ORAL_CAPSULE | ORAL | 0 refills | Status: DC
Start: 2020-09-15 — End: 2020-10-05

## 2020-09-15 NOTE — Progress Notes (Signed)
Called patient to go over any pre-op concerns. Sent rx for surgery to patient's pharmacy

## 2020-09-15 NOTE — Telephone Encounter (Signed)
He should be able to take these Day of surgery - the surgical center should go over all that with him

## 2020-09-16 ENCOUNTER — Encounter: Payer: Self-pay | Admitting: Podiatry

## 2020-09-16 DIAGNOSIS — M21541 Acquired clubfoot, right foot: Secondary | ICD-10-CM

## 2020-09-17 ENCOUNTER — Telehealth: Payer: Self-pay | Admitting: *Deleted

## 2020-09-17 NOTE — Telephone Encounter (Signed)
Called and spoke to patient. Having some pain. Advised he take medication. States toes have good coloration.  Advised to call if things worsen.

## 2020-09-17 NOTE — Telephone Encounter (Signed)
Patient is calling because his foot is feeling like it's going to sleep, tingling. Is this normal?can he take the boot off while putting on his pants as long as no pressure is put on the foot? He is icing behind the knee and elevating. Please advise.

## 2020-09-18 NOTE — Telephone Encounter (Signed)
Completed note 

## 2020-09-22 ENCOUNTER — Other Ambulatory Visit: Payer: Self-pay

## 2020-09-22 ENCOUNTER — Ambulatory Visit (INDEPENDENT_AMBULATORY_CARE_PROVIDER_SITE_OTHER): Payer: BC Managed Care – PPO

## 2020-09-22 ENCOUNTER — Ambulatory Visit (INDEPENDENT_AMBULATORY_CARE_PROVIDER_SITE_OTHER): Payer: BC Managed Care – PPO | Admitting: Podiatry

## 2020-09-22 VITALS — Temp 97.9°F

## 2020-09-22 DIAGNOSIS — Z9889 Other specified postprocedural states: Secondary | ICD-10-CM | POA: Diagnosis not present

## 2020-09-22 NOTE — Progress Notes (Signed)
  Subjective:  Patient ID: Jon Ortiz, male    DOB: 1956-01-06,  MRN: [id number]  Chief Complaint  Patient presents with   Routine Post Op    POV #1 DOS 09/16/2020 RT FOOT 2,3 HAMMERTOE CORRECTION, 2,3 METATARSAL SHORTENING OSTEOTOMY. Pt complains of pain. Nausea during the first 2 days. No fever or chills. There is a blister, redness and edema. near surgical area    DOS: 09/16/20 Procedure: Right 2nd/3rd hammertoe correction, right 2nd/3rd met shortening osteotomy  65 y.o. male presents with the above complaint. History confirmed with patient.   Objective:  Physical Exam: tenderness at the surgical site, local edema noted, and calf supple, nontender. Incision: healing well, no significant drainage, no dehiscence, no significant erythema. Blistering of 2nd toe. Good capillary   No images are attached to the encounter.  Radiographs: X-ray of the right foot: consistent with post-op state good alignment noted, no evidence of hardware complication.   Assessment:   1. Post-operative state     Plan:  Patient was evaluated and treated and all questions answered.  Post-operative State -XR reviewed with patient -Dressing applied consisting of sterile gauze, kerlix, and ACE bandage -WBAT in CAM boot -No need for pain medication refill at this time. -XRs needed at follow-up: none  No follow-ups on file.

## 2020-09-29 ENCOUNTER — Telehealth: Payer: Self-pay | Admitting: Cardiology

## 2020-09-29 ENCOUNTER — Ambulatory Visit (INDEPENDENT_AMBULATORY_CARE_PROVIDER_SITE_OTHER): Payer: BC Managed Care – PPO | Admitting: Podiatry

## 2020-09-29 ENCOUNTER — Other Ambulatory Visit: Payer: Self-pay

## 2020-09-29 DIAGNOSIS — M2041 Other hammer toe(s) (acquired), right foot: Secondary | ICD-10-CM

## 2020-09-29 MED ORDER — TRIAMTERENE-HCTZ 37.5-25 MG PO TABS: 1.0000 | ORAL_TABLET | Freq: Every day | ORAL | 3 refills | Status: DC

## 2020-09-29 NOTE — Progress Notes (Signed)
  Subjective:  Patient ID: Thao Robert Martinique III, male    DOB: [birth year],  MRN: [id number]  No chief complaint on file.  DOS: 09/16/20 Procedure: Right 2nd/3rd hammertoe correction, right 2nd/3rd met shortening osteotomy  65 y.o. male presents with the above complaint. History confirmed with patient. Pain controlled with tylenol, has been wearing the boot as directed.  Objective:  Physical Exam: tenderness at the surgical site, local edema noted, and calf supple, nontender. Incision: healing well, no significant drainage, no dehiscence, no significant erythema. Blistering of 2nd toe. Good capillary refill all digits. Assessment:   1. Hammertoe of right foot    Plan:  Patient was evaluated and treated and all questions answered.  Post-operative State -Sutures removed -Dressing applied consisting of sterile gauze, kerlix, and ACE bandage -WBAT in CAM boot -No need for pain medication refill at this time. -XRs needed at follow-up: 3 view Foot -Plan for pin removal next visit  No follow-ups on file.

## 2020-09-29 NOTE — Telephone Encounter (Signed)
Refill sent in per request.  

## 2020-09-29 NOTE — Telephone Encounter (Signed)
*  STAT* If patient is at the pharmacy, call can be transferred to refill team.   1. Which medications need to be refilled? (please list name of each medication and dose if known) Triamterene/HCTZ  2. Which pharmacy/location (including street and city if local pharmacy) is medication to be sent to? [facility]  3. Do they need a 30 day or 90 day supply? enough until his appt on 10-07-20

## 2020-10-01 ENCOUNTER — Telehealth: Payer: Self-pay | Admitting: *Deleted

## 2020-10-01 NOTE — Telephone Encounter (Signed)
Patient is calling because one of his surgery pins is working it's way out, now is about an inch further than it was after surgery. Please advise.

## 2020-10-02 ENCOUNTER — Ambulatory Visit (INDEPENDENT_AMBULATORY_CARE_PROVIDER_SITE_OTHER): Payer: BC Managed Care – PPO | Admitting: Podiatry

## 2020-10-02 ENCOUNTER — Ambulatory Visit: Payer: BC Managed Care – PPO

## 2020-10-02 ENCOUNTER — Ambulatory Visit (INDEPENDENT_AMBULATORY_CARE_PROVIDER_SITE_OTHER): Payer: BC Managed Care – PPO

## 2020-10-02 ENCOUNTER — Other Ambulatory Visit: Payer: Self-pay

## 2020-10-02 DIAGNOSIS — Z9889 Other specified postprocedural states: Secondary | ICD-10-CM

## 2020-10-05 ENCOUNTER — Other Ambulatory Visit: Payer: Self-pay

## 2020-10-05 DIAGNOSIS — I4891 Unspecified atrial fibrillation: Secondary | ICD-10-CM | POA: Insufficient documentation

## 2020-10-05 DIAGNOSIS — I10 Essential (primary) hypertension: Secondary | ICD-10-CM | POA: Insufficient documentation

## 2020-10-05 DIAGNOSIS — E785 Hyperlipidemia, unspecified: Secondary | ICD-10-CM | POA: Insufficient documentation

## 2020-10-06 ENCOUNTER — Encounter: Payer: BC Managed Care – PPO | Admitting: Podiatry

## 2020-10-07 ENCOUNTER — Encounter: Payer: Self-pay | Admitting: Cardiology

## 2020-10-07 ENCOUNTER — Ambulatory Visit (INDEPENDENT_AMBULATORY_CARE_PROVIDER_SITE_OTHER): Payer: BC Managed Care – PPO | Admitting: Cardiology

## 2020-10-07 ENCOUNTER — Other Ambulatory Visit: Payer: Self-pay

## 2020-10-07 ENCOUNTER — Telehealth: Payer: Self-pay | Admitting: *Deleted

## 2020-10-07 VITALS — BP 144/80 | HR 63 | Ht 76.0 in | Wt 295.1 lb

## 2020-10-07 DIAGNOSIS — E782 Mixed hyperlipidemia: Secondary | ICD-10-CM | POA: Diagnosis not present

## 2020-10-07 DIAGNOSIS — G4733 Obstructive sleep apnea (adult) (pediatric): Secondary | ICD-10-CM

## 2020-10-07 DIAGNOSIS — I77810 Thoracic aortic ectasia: Secondary | ICD-10-CM | POA: Diagnosis not present

## 2020-10-07 DIAGNOSIS — I10 Essential (primary) hypertension: Secondary | ICD-10-CM | POA: Diagnosis not present

## 2020-10-07 DIAGNOSIS — I480 Paroxysmal atrial fibrillation: Secondary | ICD-10-CM | POA: Diagnosis not present

## 2020-10-07 DIAGNOSIS — I700 Atherosclerosis of aorta: Secondary | ICD-10-CM

## 2020-10-07 HISTORY — DX: Atherosclerosis of aorta: I70.0

## 2020-10-07 MED ORDER — TRIAMTERENE-HCTZ 37.5-25 MG PO TABS: 1.0000 | ORAL_TABLET | Freq: Every day | ORAL | 3 refills | Status: DC

## 2020-10-07 MED ORDER — METOPROLOL SUCCINATE ER 25 MG PO TB24: 25.0000 mg | ORAL_TABLET | Freq: Every day | ORAL | 3 refills | Status: DC

## 2020-10-07 MED ORDER — FLECAINIDE ACETATE 150 MG PO TABS: ORAL_TABLET | ORAL | 3 refills | Status: DC

## 2020-10-07 MED ORDER — ROSUVASTATIN CALCIUM 10 MG PO TABS: 10.0000 mg | ORAL_TABLET | Freq: Every day | ORAL | 3 refills | Status: DC

## 2020-10-07 NOTE — Addendum Note (Signed)
Addended by: Eleonore Chiquito on: 10/07/2020 03:28 PM   Modules accepted: Orders

## 2020-10-07 NOTE — Telephone Encounter (Signed)
Patient is calling to let the physician know that his pin is coming out of hammertoe more now, Please advise.

## 2020-10-07 NOTE — Progress Notes (Signed)
Cardiology Office Note:    Date:  10/07/2020   ID:  Jon Ortiz, DOB 1955/12/05, MRN [id number]  PCP:  Ailene Ravel, MD  Cardiologist:  Garwin Brothers, MD   Referring MD: Ailene Ravel, MD    ASSESSMENT:    1. Paroxysmal atrial fibrillation (HCC)   2. Ascending aorta dilatation (HCC)   3. Essential hypertension   4. Mixed hyperlipidemia   5. OSA (obstructive sleep apnea)   6. Aortic atherosclerosis (HCC)    PLAN:    In order of problems listed above:  Primary prevention stressed to the patient.  Importance of compliance with diet medication stressed any vocalized understanding. Aortic atherosclerosis: Discussed this with him at length.  Lifestyle modification urged and he promises to comply. Mixed dyslipidemia: Lipids are still elevated.  I reviewed lipid and liver panel and change his medication from pravastatin to rosuvastatin 10 mg daily.  Diet emphasized.  He will be back in 6 weeks for liver lipid check. Essential hypertension: Blood pressure stable and diet was emphasized. Obesity: Weight reduction was stressed risks of obesity explained and he plans to do better. Ascending aortic aneurysm: He will have a CT scan without contrast to assess this.  I have not seen him in follow-up for more than a year and I discussed complaince and he promises to do better. Patient will be seen in follow-up appointment in 6 months or earlier if the patient has any concerns    Medication Adjustments/Labs and Tests Ordered: Current medicines are reviewed at length with the patient today.  Concerns regarding medicines are outlined above.  Orders Placed This Encounter  Procedures   CT CHEST WO CONTRAST   Basic metabolic panel   Hepatic function panel   Lipid panel   EKG 12-Lead    Meds ordered this encounter  Medications   triamterene-hydrochlorothiazide (MAXZIDE-25) 37.5-25 MG tablet    Sig: Take 1 tablet by mouth daily.    Dispense:  90 tablet    Refill:  3     PT WANTS 90 DAYS   metoprolol succinate (TOPROL-XL) 25 MG 24 hr tablet    Sig: Take 1 tablet (25 mg total) by mouth daily.    Dispense:  90 tablet    Refill:  3    PATIENT WANTS 90 DAYS   rosuvastatin (CRESTOR) 10 MG tablet    Sig: Take 1 tablet (10 mg total) by mouth daily.    Dispense:  90 tablet    Refill:  3      No chief complaint on file.    History of Present Illness:    Jon Ortiz is a 65 y.o. male.  Patient has past medical history of paroxysmal atrial fibrillation, essential hypertension, dyslipidemia, morbid obesity and ascending aortic dilatation.  He leads a sedentary lifestyle because he has had some foot issues and recently underwent surgery.  He denies any symptoms at this time.  No chest pain orthopnea or PND.  He had blood work done recently and his lipids are still elevated.  He has aortic atherosclerosis.  At the time of my evaluation, the patient is alert awake oriented and in no distress.  Past Medical History:  Diagnosis Date   A-fib [facility])    Ascending aorta dilatation (HCC) 01/07/2019   Dyslipidemia 03/29/2017   Essential hypertension 03/29/2017   High risk medication use 12/12/2018   Hyperlipidemia    Hypertension    OSA (obstructive sleep apnea) 05/28/2015   Paroxysmal atrial fibrillation (  HCC) 01/05/2015    Past Surgical History:  Procedure Laterality Date   APPENDECTOMY     HAMMER TOE SURGERY Right    TONSILLECTOMY      Current Medications: Current Meds  Medication Sig   aspirin EC 81 MG tablet Take 1 tablet (81 mg total) by mouth daily.   cetirizine (ZYRTEC) 10 MG tablet Take 10 mg by mouth daily.   Coenzyme Q10 (CO Q-10) 200 MG CAPS Take 200 mg by mouth daily.   flecainide (TAMBOCOR) 150 MG tablet TAKE ONE (1) TABLET BY MOUTH TWO (2) TIMES DAILY....NEEDS APPOINTMENT FOR FURTHER REFILLS   ketoconazole (NIZORAL) 2 % cream Apply 1 application topically at bedtime.   Multiple Vitamin (MULTI-VITAMINS) TABS Take 3 tablets by  mouth daily.    oxyCODONE-acetaminophen (PERCOCET) 5-325 MG tablet Take 1 tablet by mouth every 4 (four) hours as needed for severe pain.   RA KRILL OIL 500 MG CAPS Take 1 capsule by mouth daily.   rosuvastatin (CRESTOR) 10 MG tablet Take 1 tablet (10 mg total) by mouth daily.   [DISCONTINUED] metoprolol succinate (TOPROL-XL) 25 MG 24 hr tablet Take 1 tablet (25 mg total) by mouth daily.   [DISCONTINUED] pravastatin (PRAVACHOL) 10 MG tablet Take 1 tablet (10 mg total) by mouth daily.   [DISCONTINUED] triamterene-hydrochlorothiazide (MAXZIDE-25) 37.5-25 MG tablet Take 1 tablet by mouth daily.     Allergies:   Patient has no known allergies.   Social History   Socioeconomic History   Marital status: Married    Spouse name: Not on file   Number of children: Not on file   Years of education: Not on file   Highest education level: Not on file  Occupational History   Not on file  Tobacco Use   Smoking status: Former   Smokeless tobacco: Never  Vaping Use   Vaping Use: Never used  Substance and Sexual Activity   Alcohol use: No   Drug use: No   Sexual activity: Not on file  Other Topics Concern   Not on file  Social History Narrative   Not on file   Social Determinants of Health   Financial Resource Strain: Not on file  Food Insecurity: Not on file  Transportation Needs: Not on file  Physical Activity: Not on file  Stress: Not on file  Social Connections: Not on file     Family History: The patient's family history includes Arrhythmia in his father; Heart disease in his father; Valvular heart disease in his mother.  ROS:   Please see the history of present illness.    All other systems reviewed and are negative.  EKGs/Labs/Other Studies Reviewed:    The following studies were reviewed today: EKG was sinus rhythm with nonspecific ST-T changes QRS is within normal limits   Recent Labs: No results found for requested labs within last 8760 hours.  Recent Lipid  Panel No results found for: CHOL, TRIG, HDL, CHOLHDL, VLDL, LDLCALC, LDLDIRECT  Physical Exam:    VS:  BP (!) 144/80   Pulse 63   Ht 6\' 4"  (1.93 m)   Wt 295 lb 1.9 oz (133.9 kg)   SpO2 96%   BMI 35.92 kg/m     Wt Readings from Last 3 Encounters:  10/07/20 295 lb 1.9 oz (133.9 kg)  07/11/19 294 lb 9.6 oz (133.6 kg)  01/07/19 292 lb (132.5 kg)     GEN: Patient is in no acute distress HEENT: Normal NECK: No JVD; No carotid bruits LYMPHATICS: No lymphadenopathy  CARDIAC: Hear sounds regular, 2/6 systolic murmur at the apex. RESPIRATORY:  Clear to auscultation without rales, wheezing or rhonchi  ABDOMEN: Soft, non-tender, non-distended MUSCULOSKELETAL:  No edema; No deformity  SKIN: Warm and dry NEUROLOGIC:  Alert and oriented x 3 PSYCHIATRIC:  Normal affect   Signed, Garwin Brothers, MD  10/07/2020 3:21 PM    [facility]

## 2020-10-07 NOTE — Patient Instructions (Signed)
Medication Instructions:  Your physician has recommended you make the following change in your medication:   Stop pravastatin Start Crestor 10 mg daily.  *If you need a refill on your cardiac medications before your next appointment, please call your pharmacy*   Lab Work: Your physician recommends that you return for lab work in: 6 weeks You need to have labs done when you are fasting.  You can come Monday through Friday 8:30 am to 12:00 pm and 1:15 to 4:30. You do not need to make an appointment as the order has already been placed. The labs you are going to have done are BMET, LFT and Lipids.  If you have labs (blood work) drawn today and your tests are completely normal, you will receive your results only by: MyChart Message (if you have MyChart) OR A paper copy in the mail If you have any lab test that is abnormal or we need to change your treatment, we will call you to review the results.   Testing/Procedures: Non-Cardiac CT Angiography (CTA), is a special type of CT scan that uses a computer to produce multi-dimensional views of major blood vessels throughout the body. In CT angiography, a contrast material is injected through an IV to help visualize the blood vessels    Follow-Up: At [facility], you and your health needs are our priority.  As part of our continuing mission to provide you with exceptional heart care, we have created designated Provider Care Teams.  These Care Teams include your primary Cardiologist (physician) and Advanced Practice Providers (APPs -  Physician Assistants and Nurse Practitioners) who all work together to provide you with the care you need, when you need it.  We recommend signing up for the patient portal called "MyChart".  Sign up information is provided on this After Visit Summary.  MyChart is used to connect with patients for Virtual Visits (Telemedicine).  Patients are able to view lab/test results, encounter notes, upcoming appointments, etc.   Non-urgent messages can be sent to your provider as well.   To learn more about what you can do with MyChart, go to [email or web address].    Your next appointment:   6 month(s)  The format for your next appointment:   In Person  Provider:   Belva Crome, MD   Other Instructions NA

## 2020-10-07 NOTE — Telephone Encounter (Signed)
Returned call to patient and gave recommendations, he is scheduled for sooner appointment on Friday @9 :45.

## 2020-10-08 ENCOUNTER — Encounter: Payer: Self-pay | Admitting: *Deleted

## 2020-10-08 NOTE — Telephone Encounter (Signed)
Thank you :)

## 2020-10-08 NOTE — Telephone Encounter (Signed)
Patient has been scheduled

## 2020-10-09 ENCOUNTER — Ambulatory Visit (INDEPENDENT_AMBULATORY_CARE_PROVIDER_SITE_OTHER): Payer: BC Managed Care – PPO | Admitting: Podiatry

## 2020-10-09 ENCOUNTER — Other Ambulatory Visit: Payer: Self-pay

## 2020-10-09 ENCOUNTER — Ambulatory Visit (INDEPENDENT_AMBULATORY_CARE_PROVIDER_SITE_OTHER): Payer: BC Managed Care – PPO

## 2020-10-09 DIAGNOSIS — M2041 Other hammer toe(s) (acquired), right foot: Secondary | ICD-10-CM | POA: Diagnosis not present

## 2020-10-09 NOTE — Progress Notes (Signed)
  Subjective:  Patient ID: Jon Ortiz, male    DOB: 1955-10-01,  MRN: [id number]  Chief Complaint  Patient presents with   Routine Post Op    right foot; pin is coming out    DOS: 09/16/20 Procedure: Right 2nd/3rd hammertoe correction, right 2nd/3rd met shortening osteotomy  65 y.o. male presents with the above complaint. History confirmed with patient. Pain controlled with tylenol, has been wearing the boot as directed.  Objective:  Physical Exam: tenderness at the surgical site, local edema noted, and calf supple, nontender. Incision: healing well, no significant drainage, no dehiscence, no significant erythema. Good capillary refill all digits.  Slight backing out of second toe pin Assessment:   1. Post-operative state    Plan:  Patient was evaluated and treated and all questions answered.  Post-operative State -X-rays taken reviewed mild pin backing out but still in the metatarsal canal.  The pin was cut short today but left intact.  Plan for removal next visit.  Foot redressed.  Discussed limiting weightbearing to prevent further pin migration  No follow-ups on file.

## 2020-10-09 NOTE — Progress Notes (Signed)
  Subjective:  Patient ID: Jon Ortiz, male    DOB: 1956-01-08,  MRN: [id number]  Chief Complaint  Patient presents with   Routine Post Op    POV Pt states his pain is coming out and has had some pain in this digit. Denies fever/chills/nausea/vomiting. Pt has no other concerns at this time.    DOS: 09/16/20 Procedure: Right 2nd/3rd hammertoe correction, right 2nd/3rd met shortening osteotomy  65 y.o. male presents with the above complaint. History confirmed with patient.  Objective:  Physical Exam: tenderness at the surgical site, local edema noted, and calf supple, nontender. Incision: healing well, no significant drainage, no dehiscence, no significant erythema. Good capillary refill all digits.  Slight backing out of second toe pin Assessment:   1. Hammertoe of right foot    Plan:  Patient was evaluated and treated and all questions answered.  Post-operative State -New XR taken. Bridging across the [place]. Pins removed today -Band-aids applied to toes -Work on ROM 2nd MPJ with care to manually stretch in plantarflexed position. -Start WB in surgical shoe. Dispensed today.   Return in about 2 weeks (around 10/23/2020) for with XRs.

## 2020-10-13 ENCOUNTER — Encounter: Payer: BC Managed Care – PPO | Admitting: Podiatry

## 2020-10-22 ENCOUNTER — Other Ambulatory Visit: Payer: Self-pay

## 2020-10-22 ENCOUNTER — Ambulatory Visit (HOSPITAL_BASED_OUTPATIENT_CLINIC_OR_DEPARTMENT_OTHER)
Admission: RE | Admit: 2020-10-22 | Discharge: 2020-10-22 | Disposition: A | Discharge status: Home / Self Care | Payer: BC Managed Care – PPO | Source: Ambulatory Visit | Attending: Cardiology | Admitting: Cardiology

## 2020-10-22 DIAGNOSIS — I77810 Thoracic aortic ectasia: Secondary | ICD-10-CM | POA: Diagnosis not present

## 2020-10-23 ENCOUNTER — Ambulatory Visit (INDEPENDENT_AMBULATORY_CARE_PROVIDER_SITE_OTHER): Payer: BC Managed Care – PPO | Admitting: Podiatry

## 2020-10-23 ENCOUNTER — Ambulatory Visit (INDEPENDENT_AMBULATORY_CARE_PROVIDER_SITE_OTHER): Payer: BC Managed Care – PPO

## 2020-10-23 DIAGNOSIS — M2041 Other hammer toe(s) (acquired), right foot: Secondary | ICD-10-CM

## 2020-10-23 DIAGNOSIS — Z9889 Other specified postprocedural states: Secondary | ICD-10-CM | POA: Diagnosis not present

## 2020-10-23 NOTE — Progress Notes (Signed)
  Subjective:  Patient ID: Jon Ortiz, male    DOB: Jan 27, 1956,  MRN: [id number]  Chief Complaint  Patient presents with   Routine Post Op    POV -pt deneis N/V/F/Ch -pt states,: really no pain for a few days." - pt concern of a black spot at where the pins were removed from 2nd and 3rd toes - swelling and redness remains the same tx: sx shoe   DOS: 09/16/20 Procedure: Right 2nd/3rd hammertoe correction, right 2nd/3rd met shortening osteotomy  65 y.o. male presents with the above complaint. History confirmed with patient.  Objective:  Physical Exam: no tenderness at the surgical site, local edema noted, and calf supple, nontender. 2nd toe slight hammertoe deformity, 3rd toe rectus Incision: healed. Dried blood distal pin sites. Assessment:   1. Hammertoe of right foot   2. Post-operative state    Plan:  Patient was evaluated and treated and all questions answered.  Post-operative State -Xrs reviewed with patient. Osteotomies appear healed, 2nd toe appears with slight PF deformity without bridging. On exam the toe is flexible and should not cause dorsal irritation. No prominent mets plantarly. Patient to work on ROM of the 2nd MPJs and can try manually stretching the 2nd toe. -Transition to normal shoes, starting with 2 hours today then doubling every day. -F/u in 3 weeks for new Xrs.   Return in about 3 weeks (around 11/13/2020).

## 2020-11-13 ENCOUNTER — Ambulatory Visit (INDEPENDENT_AMBULATORY_CARE_PROVIDER_SITE_OTHER): Payer: BC Managed Care – PPO | Admitting: Podiatry

## 2020-11-13 ENCOUNTER — Other Ambulatory Visit: Payer: Self-pay

## 2020-11-13 ENCOUNTER — Ambulatory Visit (INDEPENDENT_AMBULATORY_CARE_PROVIDER_SITE_OTHER): Payer: BC Managed Care – PPO

## 2020-11-13 DIAGNOSIS — M2041 Other hammer toe(s) (acquired), right foot: Secondary | ICD-10-CM | POA: Diagnosis not present

## 2020-11-13 NOTE — Progress Notes (Signed)
  Subjective:  Patient ID: Jon Ortiz, male    DOB: 1955-05-26,  MRN: [id number]  No chief complaint on file.  DOS: 09/16/20 Procedure: Right 2nd/3rd hammertoe correction, right 2nd/3rd met shortening osteotomy  65 y.o. male presents with the above complaint. History confirmed with patient. Has some pain behind his toes on the 2nd/3rd toe area but otherwise doing well. No pain at the toes or underneath the toes themselves.  Objective:  Physical Exam: no tenderness at the surgical site, no edema noted, and calf supple, nontender. 2nd toe slight hammertoe deformity, 3rd toe rectus Incision: healed Assessment:   1. Hammertoe of right foot    Plan:  Patient was evaluated and treated and all questions answered.  Post-operative State -Xrs reviewed with patient. Good healing of met osteotomies with continued deformity 2nd toe. This clinically does not appear to be causing pressure or concerns. He is getting some pain at the mid-shaft metatarsals which should subside in time as he gets more used to shoegear. Will continue stretching and f/u in 6 weeks for recheck.   Return in about 6 weeks (around 12/25/2020).

## 2020-12-29 ENCOUNTER — Ambulatory Visit: Payer: BC Managed Care – PPO | Admitting: Podiatry

## 2021-01-05 ENCOUNTER — Other Ambulatory Visit: Payer: Self-pay

## 2021-01-05 ENCOUNTER — Ambulatory Visit (INDEPENDENT_AMBULATORY_CARE_PROVIDER_SITE_OTHER): Payer: BC Managed Care – PPO | Admitting: Podiatry

## 2021-01-05 DIAGNOSIS — Z9889 Other specified postprocedural states: Secondary | ICD-10-CM

## 2021-01-05 DIAGNOSIS — M2041 Other hammer toe(s) (acquired), right foot: Secondary | ICD-10-CM

## 2021-01-05 NOTE — Progress Notes (Signed)
  Subjective:  Patient ID: Jon Ortiz, male    DOB: 1955/07/17,  MRN: [id number]  No chief complaint on file.  DOS: 09/16/20 Procedure: Right 2nd/3rd hammertoe correction, right 2nd/3rd met shortening osteotomy  65 y.o. male presents with the above complaint. History confirmed with patient. Doing well denies pain, concerns, or post-op issues.  Objective:  Physical Exam: no tenderness at the surgical site, no edema noted, and calf supple, nontender. 2nd toe slight hammertoe deformity without POP, 3rd toe rectus Incision: healed Assessment:   1. Hammertoe of right foot   2. Post-operative state    Plan:  Patient was evaluated and treated and all questions answered.  Post-operative State -Doing very well today. Denies pain or issues. Wearing normal shoegear without concerns. Will discharge at this time with f/u as needed.  Return if symptoms worsen or fail to improve.

## 2021-03-24 ENCOUNTER — Other Ambulatory Visit: Payer: Self-pay

## 2021-03-24 ENCOUNTER — Ambulatory Visit (INDEPENDENT_AMBULATORY_CARE_PROVIDER_SITE_OTHER): Payer: BC Managed Care – PPO | Admitting: Cardiology

## 2021-03-24 ENCOUNTER — Encounter: Payer: Self-pay | Admitting: Cardiology

## 2021-03-24 VITALS — BP 136/70 | HR 59 | Ht 76.0 in | Wt 290.1 lb

## 2021-03-24 DIAGNOSIS — I77810 Thoracic aortic ectasia: Secondary | ICD-10-CM | POA: Diagnosis not present

## 2021-03-24 DIAGNOSIS — G4733 Obstructive sleep apnea (adult) (pediatric): Secondary | ICD-10-CM

## 2021-03-24 DIAGNOSIS — I700 Atherosclerosis of aorta: Secondary | ICD-10-CM

## 2021-03-24 DIAGNOSIS — I480 Paroxysmal atrial fibrillation: Secondary | ICD-10-CM

## 2021-03-24 DIAGNOSIS — I10 Essential (primary) hypertension: Secondary | ICD-10-CM

## 2021-03-24 NOTE — Progress Notes (Signed)
Cardiology Office Note:    Date:  03/24/2021   ID:  Jon Ortiz, DOB [birth year], MRN [id number]  PCP:  Ailene Ravel, MD  Cardiologist:  Garwin Brothers, MD   Referring MD: Ailene Ravel, MD    ASSESSMENT:    1. Essential hypertension   2. Paroxysmal atrial fibrillation (HCC)   3. Aortic atherosclerosis (HCC)   4. Ascending aorta dilatation (HCC)   5. OSA (obstructive sleep apnea)    PLAN:    In order of problems listed above:  Paroxysmal atrial fibrillation: His CHA2DS2-VASc score is at best 1 based on his aortic atherosclerosis.  He has no history of essential hypertension.  Therefore is not on anticoagulation. Ascending aortic dilatation: Stable at this time we will continue to monitor.  He was advised about the importance of diet and exercise.  He promises to walk at least 30 minutes a day 5 days a week. Aortic atherosclerosis: Stable at this time.  On lipid-lowering therapy with statins and lipids were reviewed. Obesity: Weight reduction was stressed and diet was emphasized and he promises to do better.  He has significant abdominal obesity. Mixed dyslipidemia: As mentioned above. Patient will be seen in follow-up appointment in 6 months or earlier if the patient has any concerns   Medication Adjustments/Labs and Tests Ordered: Current medicines are reviewed at length with the patient today.  Concerns regarding medicines are outlined above.  Orders Placed This Encounter  Procedures   EKG 12-Lead   No orders of the defined types were placed in this encounter.    No chief complaint on file.    History of Present Illness:    Jon Ortiz is a 66 y.o. male.  Patient has past medical history of ascending aortic dilatation, aortic atherosclerosis dyslipidemia and sleep apnea.  He has history of obesity and leads a sedentary lifestyle.  He denies any chest pain orthopnea or PND.  At the time of my evaluation, the patient is alert awake  oriented and in no distress.  Past Medical History:  Diagnosis Date   A-fib [facility])    Aortic atherosclerosis (HCC) 10/07/2020   Ascending aorta dilatation (HCC) 01/07/2019   Dyslipidemia 03/29/2017   Essential hypertension 03/29/2017   High risk medication use 12/12/2018   Hyperlipidemia    Hypertension    OSA (obstructive sleep apnea) 05/28/2015   Paroxysmal atrial fibrillation (HCC) 01/05/2015    Past Surgical History:  Procedure Laterality Date   APPENDECTOMY     HAMMER TOE SURGERY Right    TONSILLECTOMY      Current Medications: Current Meds  Medication Sig   aspirin EC 81 MG tablet Take 1 tablet (81 mg total) by mouth daily.   cetirizine (ZYRTEC) 10 MG tablet Take 10 mg by mouth daily.   Coenzyme Q10 (CO Q-10) 200 MG CAPS Take 200 mg by mouth daily.   flecainide (TAMBOCOR) 150 MG tablet TAKE ONE (1) TABLET BY MOUTH TWO (2) TIMES DAILY.   ketoconazole (NIZORAL) 2 % cream Apply 1 application topically at bedtime.   metoprolol succinate (TOPROL-XL) 25 MG 24 hr tablet Take 1 tablet (25 mg total) by mouth daily.   Multiple Vitamin (MULTI-VITAMINS) TABS Take 3 tablets by mouth daily.    RA KRILL OIL 500 MG CAPS Take 1 capsule by mouth daily.   rosuvastatin (CRESTOR) 10 MG tablet Take 10 mg by mouth daily.   triamterene-hydrochlorothiazide (MAXZIDE-25) 37.5-25 MG tablet Take 1 tablet by mouth daily.  Allergies:   Patient has no known allergies.   Social History   Socioeconomic History   Marital status: Married    Spouse name: Not on file   Number of children: Not on file   Years of education: Not on file   Highest education level: Not on file  Occupational History   Not on file  Tobacco Use   Smoking status: Former   Smokeless tobacco: Never  Vaping Use   Vaping Use: Never used  Substance and Sexual Activity   Alcohol use: No   Drug use: No   Sexual activity: Not on file  Other Topics Concern   Not on file  Social History Narrative   Not on file   Social  Determinants of Health   Financial Resource Strain: Not on file  Food Insecurity: Not on file  Transportation Needs: Not on file  Physical Activity: Not on file  Stress: Not on file  Social Connections: Not on file     Family History: The patient's family history includes Arrhythmia in his father; Heart disease in his father; Valvular heart disease in his mother.  ROS:   Please see the history of present illness.    All other systems reviewed and are negative.  EKGs/Labs/Other Studies Reviewed:    The following studies were reviewed today: EKG reveals sinus rhythm and nonspecific ST-T changes   Recent Labs: No results found for requested labs within last 8760 hours.  Recent Lipid Panel No results found for: CHOL, TRIG, HDL, CHOLHDL, VLDL, LDLCALC, LDLDIRECT  Physical Exam:    VS:  BP 136/70    Pulse (!) 59    Ht 6\' 4"  (1.93 m)    Wt 290 lb 1.3 oz (131.6 kg)    SpO2 97%    BMI 35.31 kg/m     Wt Readings from Last 3 Encounters:  03/24/21 290 lb 1.3 oz (131.6 kg)  10/07/20 295 lb 1.9 oz (133.9 kg)  07/11/19 294 lb 9.6 oz (133.6 kg)     GEN: Patient is in no acute distress HEENT: Normal NECK: No JVD; No carotid bruits LYMPHATICS: No lymphadenopathy CARDIAC: Hear sounds regular, 2/6 systolic murmur at the apex. RESPIRATORY:  Clear to auscultation without rales, wheezing or rhonchi  ABDOMEN: Soft, non-tender, non-distended MUSCULOSKELETAL:  No edema; No deformity  SKIN: Warm and dry NEUROLOGIC:  Alert and oriented x 3 PSYCHIATRIC:  Normal affect   Signed, 07/13/19, MD  03/24/2021 3:40 PM    [facility]

## 2021-03-24 NOTE — Patient Instructions (Signed)

## 2021-03-26 MED ORDER — ROSUVASTATIN CALCIUM 10 MG PO TABS: 10.0000 mg | ORAL_TABLET | Freq: Every day | ORAL | 3 refills | Status: DC

## 2021-03-26 MED ORDER — FLECAINIDE ACETATE 150 MG PO TABS: ORAL_TABLET | ORAL | 3 refills | Status: DC

## 2021-03-26 MED ORDER — TRIAMTERENE-HCTZ 37.5-25 MG PO TABS: 1.0000 | ORAL_TABLET | Freq: Every day | ORAL | 3 refills | Status: DC

## 2021-03-26 MED ORDER — METOPROLOL SUCCINATE ER 25 MG PO TB24: 25.0000 mg | ORAL_TABLET | Freq: Every day | ORAL | 3 refills | Status: DC

## 2021-03-26 NOTE — Addendum Note (Signed)
Addended by: Truddie Hidden on: 03/26/2021 10:18 AM   Modules accepted: Orders

## 2022-03-09 IMAGING — CT CT CHEST W/O CM
2 of 3 series · 15 of 36 positions shown, 18 images · non-contrast
Comparison: 02/11/2019

CLINICAL DATA: Follow-up ascending aortic dilatation

EXAM:
CT CHEST WITHOUT CONTRAST
TECHNIQUE: Multidetector CT imaging of the chest was performed following the
standard protocol without IV contrast.

[Series 2: thorax · axial · 0.87mm/px · z∈[-364,-64]mm · 12 of 176 slices shown, 15 images]
[im 13/176  mediastinal]
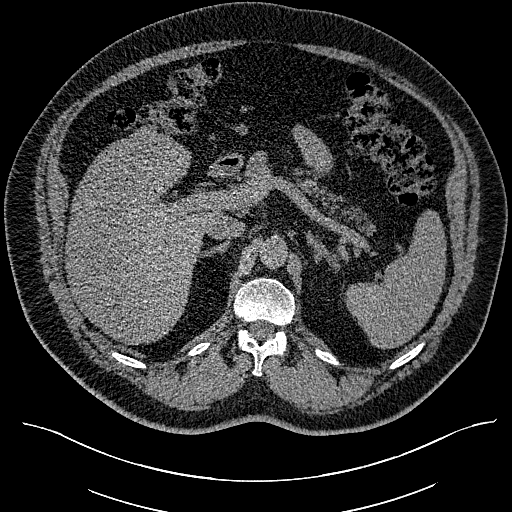
[im 13/176  lung]
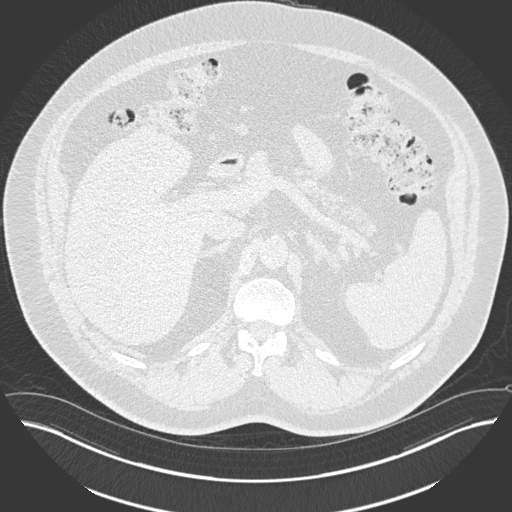
[im 26/176  lung]
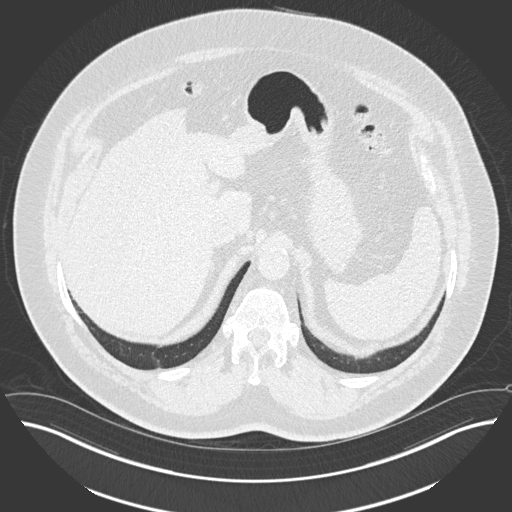
[im 39/176  lung]
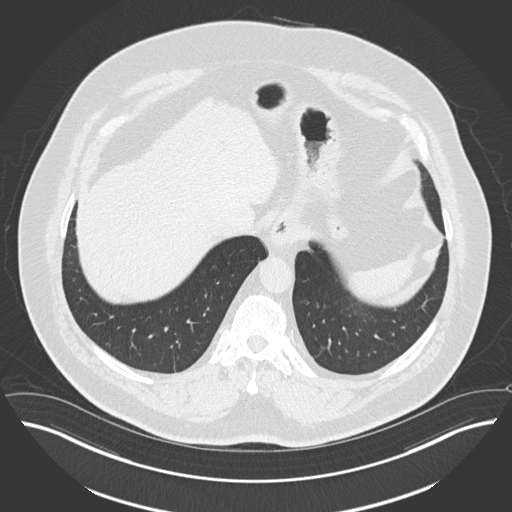
[im 52/176  lung]
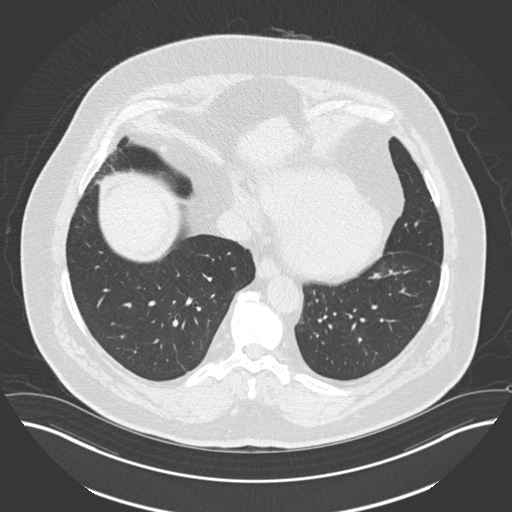
[im 65/176  mediastinal]
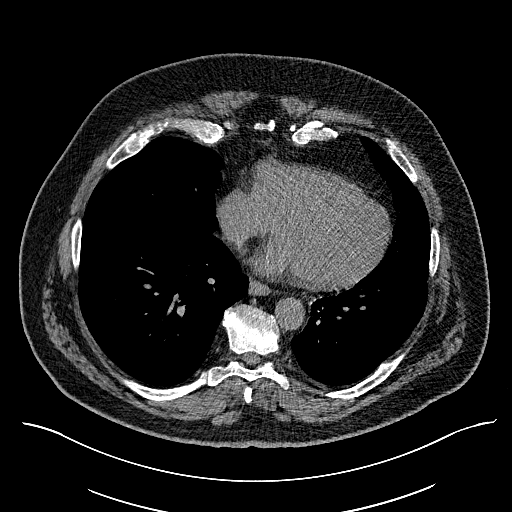
[im 65/176  lung]
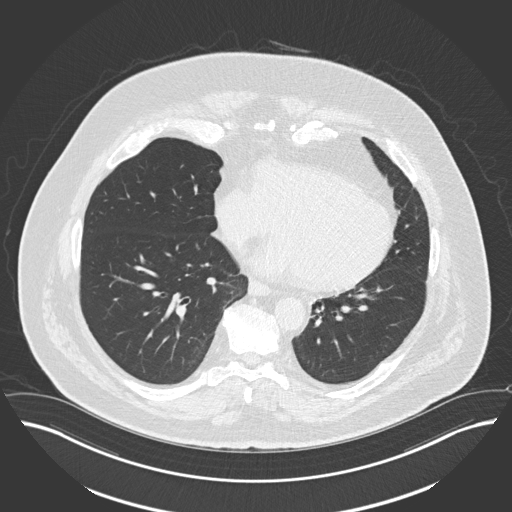
[im 78/176  lung]
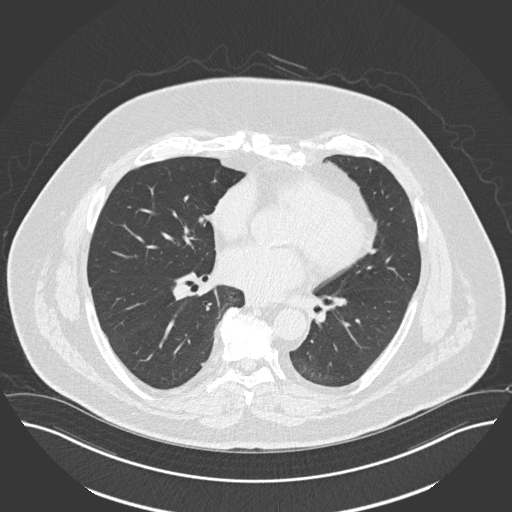
[im 98/176  lung]
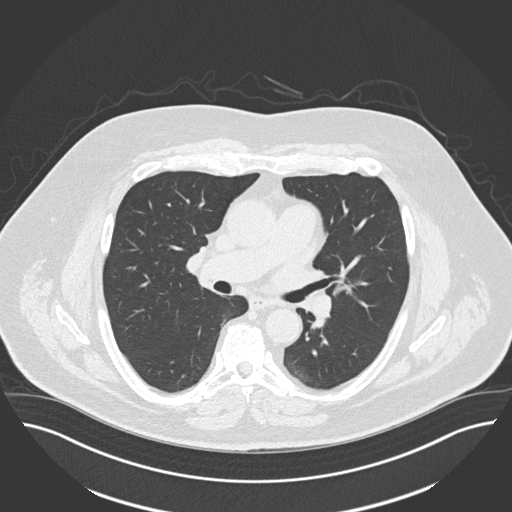
[im 111/176  lung]
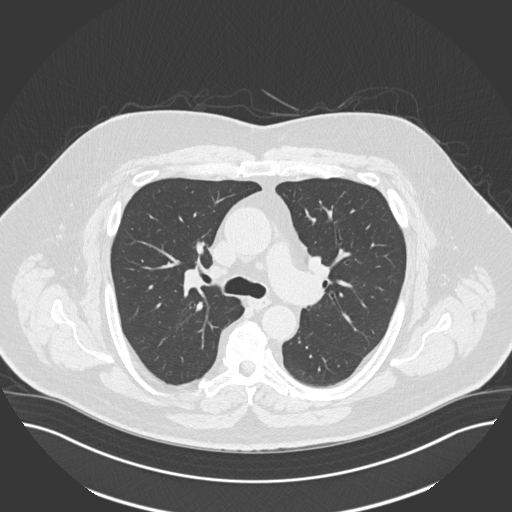
[im 124/176  mediastinal]
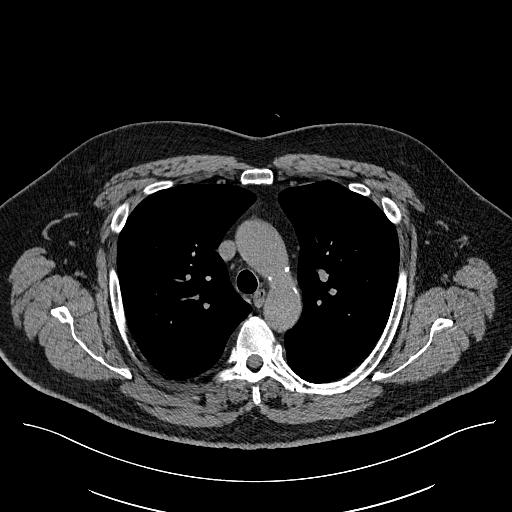
[im 124/176  lung]
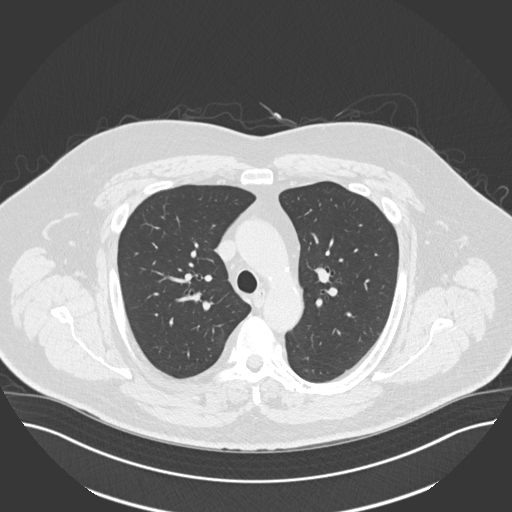
[im 137/176  lung]
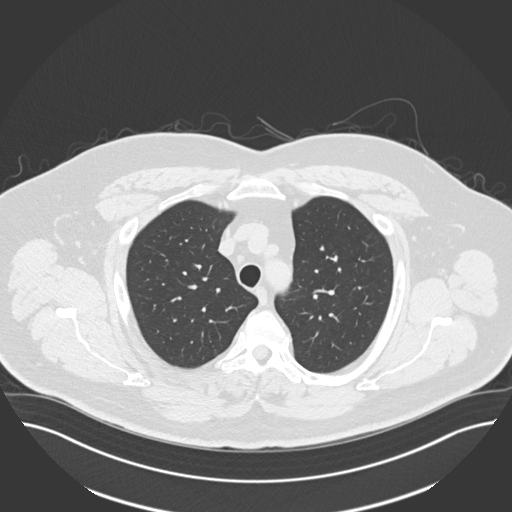
[im 150/176  lung]
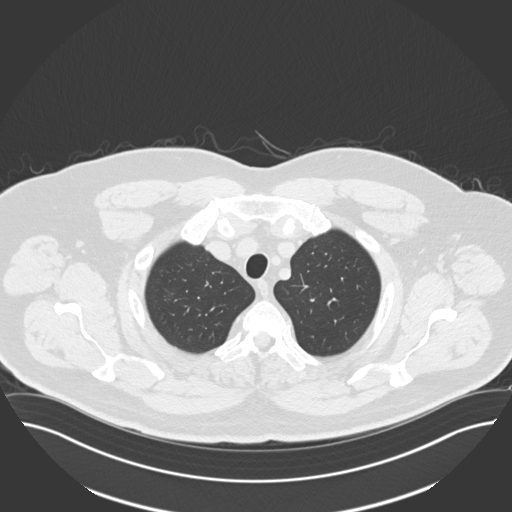
[im 163/176  lung]
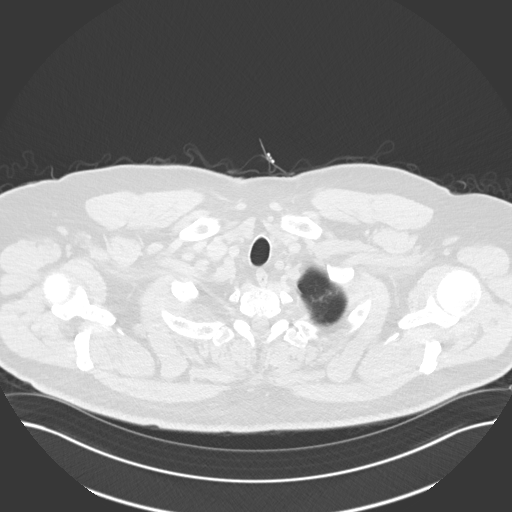

[Series 5: coronal · coronal · 0.71mm/px · 3 of 174 slices shown]
[im 35/174  lung]
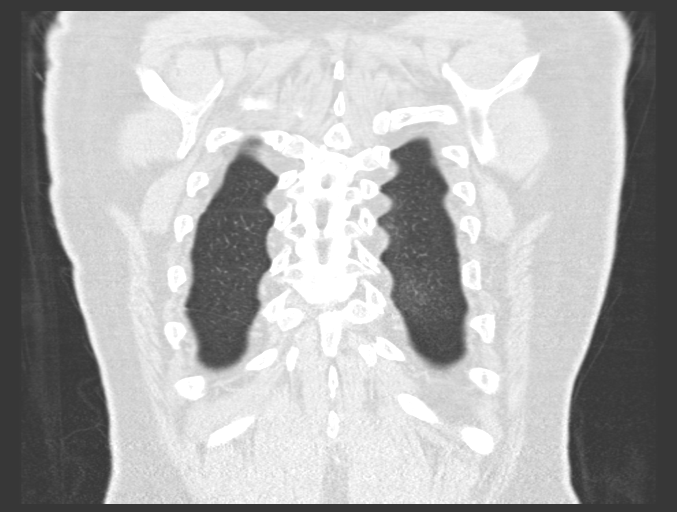
[im 70/174  lung]
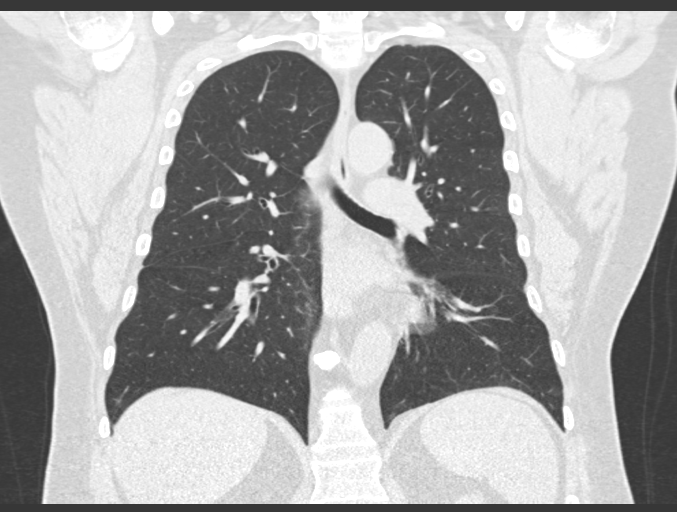
[im 104/174  lung]
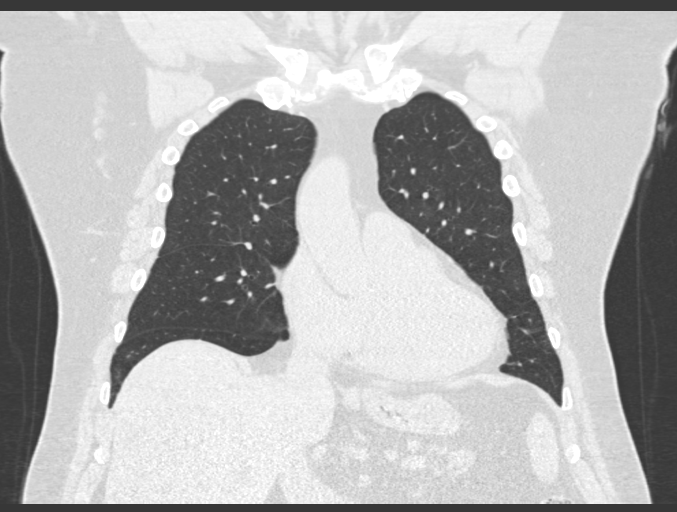

[15 of 36 positions shown; findings below may reference images not displayed]

FINDINGS: Cardiovascular: Unchanged enlargement of the tubular ascending
thoracic aorta, measuring 4.1 x 4.0 cm (series 2, image 77). Normal
caliber of the descending thoracic aorta, measuring up to 2.8 x
cm. Normal heart size. No pericardial effusion.

Mediastinum/Nodes: No enlarged mediastinal, hilar, or axillary lymph
nodes. Thyroid gland, trachea, and esophagus demonstrate no
significant findings.

Lungs/Pleura: Occasional small bilateral pulmonary nodules, stable
and definitively benign, for example a 3 mm nodule in the posterior
left upper lobe (series 4, image 44) and a 5 mm nodule of the
superior segment right lower lobe (series 4, image 82). Minimal
scarring of the bilateral lung bases. No pleural effusion or
pneumothorax.

Upper Abdomen: No acute abnormality.

Musculoskeletal: No chest wall mass or suspicious bone lesions
identified.
IMPRESSION: 1. Unchanged enlargement of the tubular ascending thoracic aorta,
measuring 4.1 x 4.0 cm. Recommend annual imaging followup by CTA or
MRA. This recommendation follows 5808
ACCF/AHA/AATS/ACR/ASA/SCA/ALPHONSE/JUELL/PARTRIDGE/KURNOSA Guidelines for the
Diagnosis and Management of Patients with Thoracic Aortic Disease.
Circulation. 5808; 121: E266-e369. Aortic aneurysm NOS ([id number].X)
2. Occasional small bilateral pulmonary nodules, stable and
definitively benign. No further follow-up or characterization is
required.

## 2022-03-23 ENCOUNTER — Other Ambulatory Visit: Payer: Self-pay

## 2022-03-23 DIAGNOSIS — I480 Paroxysmal atrial fibrillation: Secondary | ICD-10-CM

## 2022-03-23 DIAGNOSIS — I10 Essential (primary) hypertension: Secondary | ICD-10-CM

## 2022-03-23 MED ORDER — ROSUVASTATIN CALCIUM 10 MG PO TABS: 10.0000 mg | ORAL_TABLET | Freq: Every day | ORAL | 0 refills | Status: DC

## 2022-03-23 MED ORDER — METOPROLOL SUCCINATE ER 25 MG PO TB24: 25.0000 mg | ORAL_TABLET | Freq: Every day | ORAL | 0 refills | Status: DC

## 2022-03-23 MED ORDER — TRIAMTERENE-HCTZ 37.5-25 MG PO TABS: 1.0000 | ORAL_TABLET | Freq: Every day | ORAL | 0 refills | Status: DC

## 2022-03-23 MED ORDER — FLECAINIDE ACETATE 150 MG PO TABS: 150.0000 mg | ORAL_TABLET | Freq: Two times a day (BID) | ORAL | 0 refills | Status: DC

## 2022-04-27 ENCOUNTER — Telehealth: Payer: Self-pay | Admitting: Cardiology

## 2022-04-27 DIAGNOSIS — I10 Essential (primary) hypertension: Secondary | ICD-10-CM

## 2022-04-27 DIAGNOSIS — I480 Paroxysmal atrial fibrillation: Secondary | ICD-10-CM

## 2022-04-27 MED ORDER — TRIAMTERENE-HCTZ 37.5-25 MG PO TABS: 1.0000 | ORAL_TABLET | Freq: Every day | ORAL | 0 refills | Status: DC

## 2022-04-27 MED ORDER — METOPROLOL SUCCINATE ER 25 MG PO TB24: 25.0000 mg | ORAL_TABLET | Freq: Every day | ORAL | 0 refills | Status: DC

## 2022-04-27 MED ORDER — ROSUVASTATIN CALCIUM 10 MG PO TABS: 10.0000 mg | ORAL_TABLET | Freq: Every day | ORAL | 0 refills | Status: DC

## 2022-04-27 NOTE — Telephone Encounter (Signed)
Refill of Metoprolol Succinate 25 mg, Rosuvastatin 10 mg, and Maxzide-25 sent to [facility].

## 2022-04-27 NOTE — Telephone Encounter (Signed)
*  STAT* If patient is at the pharmacy, call can be transferred to refill team.   1. Which medications need to be refilled? (please list name of each medication and dose if known)   metoprolol succinate (TOPROL-XL) 25 MG 24 hr tablet  rosuvastatin (CRESTOR) 10 MG tablet  triamterene-hydrochlorothiazide (MAXZIDE-25) 37.5-25 MG tablet   2. Which pharmacy/location (including street and city if local pharmacy) is medication to be sent to?  [place], [place]   3. Do they need a 30 day or 90 day supply? 90 day  Patient stated he almost out of these medications.  Patient has appointment scheduled on 2/29.

## 2022-05-09 DIAGNOSIS — S83249A Other tear of medial meniscus, current injury, unspecified knee, initial encounter: Secondary | ICD-10-CM | POA: Insufficient documentation

## 2022-05-10 ENCOUNTER — Telehealth: Payer: Self-pay

## 2022-05-10 NOTE — Telephone Encounter (Signed)
   Pre-operative Risk Assessment    Patient Name: Jon Ortiz  DOB: 02-07-1956 MRN: [id number]      Request for Surgical Clearance    Procedure:   left knee scope PMM, chondroplasty  Date of Surgery:  Clearance TBD                                 Surgeon:  Dr. Jonn Shingles Surgeon's Group or Practice Name:  [facility] Phone number:  [phone] Fax number:  [phone] Attention Kerri maze   Type of Clearance Requested:   - Medical    Type of Anesthesia:  General    Additional requests/questions:    Gretchen Short   05/10/2022, 2:58 PM

## 2022-05-10 NOTE — Telephone Encounter (Addendum)
    Patient Name: Jon Ortiz  DOB: 03/04/1956 MRN: [id number]  Primary Cardiologist: Jenean Lindau, MD  Chart reviewed as part of pre-operative protocol coverage. Patient overdue for 1 year f/u. The patient already has an upcoming appointment scheduled 05/12/22 with Dr. Dr. [name] at which time this clearance should be addressed. There are no blood thinners listed to hold on request. Will defer to Dr. [name] at visit for advisement on whether to hold ASA.  - I added "preop" comment to appointment notes so that provider is aware to address at time of OV. Per office protocol, the provider seeing this patient should forward their finalized clearance decision to requesting party below.  - Will fax update to requesting surgeon so they are aware. Will remove from preop box as separate preop APP input not necessary at this time.  Note: erroneously routed to Dr. Agustin Cree - meant to route to Dr. [name]. Dr. Agustin Cree, please disregard.  Jon Pitter, PA-C 05/10/2022, 3:36 PM

## 2022-05-12 ENCOUNTER — Encounter: Payer: Self-pay | Admitting: Cardiology

## 2022-05-12 ENCOUNTER — Ambulatory Visit: Payer: BC Managed Care – PPO | Attending: Cardiology | Admitting: Cardiology

## 2022-05-12 VITALS — BP 166/76 | HR 53 | Ht 76.0 in | Wt 305.1 lb

## 2022-05-12 DIAGNOSIS — E669 Obesity, unspecified: Secondary | ICD-10-CM

## 2022-05-12 DIAGNOSIS — I10 Essential (primary) hypertension: Secondary | ICD-10-CM | POA: Diagnosis not present

## 2022-05-12 DIAGNOSIS — E785 Hyperlipidemia, unspecified: Secondary | ICD-10-CM

## 2022-05-12 DIAGNOSIS — I77810 Thoracic aortic ectasia: Secondary | ICD-10-CM | POA: Diagnosis not present

## 2022-05-12 DIAGNOSIS — I700 Atherosclerosis of aorta: Secondary | ICD-10-CM

## 2022-05-12 DIAGNOSIS — I480 Paroxysmal atrial fibrillation: Secondary | ICD-10-CM | POA: Diagnosis not present

## 2022-05-12 DIAGNOSIS — R0609 Other forms of dyspnea: Secondary | ICD-10-CM

## 2022-05-12 DIAGNOSIS — G4733 Obstructive sleep apnea (adult) (pediatric): Secondary | ICD-10-CM

## 2022-05-12 MED ORDER — FLECAINIDE ACETATE 150 MG PO TABS: 150.0000 mg | ORAL_TABLET | Freq: Two times a day (BID) | ORAL | 3 refills | Status: AC

## 2022-05-12 MED ORDER — METOPROLOL SUCCINATE ER 25 MG PO TB24: 25.0000 mg | ORAL_TABLET | Freq: Every day | ORAL | 3 refills | Status: AC

## 2022-05-12 MED ORDER — ROSUVASTATIN CALCIUM 10 MG PO TABS: 10.0000 mg | ORAL_TABLET | Freq: Every day | ORAL | 3 refills | Status: AC

## 2022-05-12 MED ORDER — TRIAMTERENE-HCTZ 37.5-25 MG PO TABS: 1.0000 | ORAL_TABLET | Freq: Every day | ORAL | 3 refills | Status: DC

## 2022-05-12 NOTE — Progress Notes (Signed)
Cardiology Office Note:    Date:  05/12/2022   ID:  Jon Ortiz, DOB 1955/08/02, MRN [id number]  PCP:  Leonides Sake, MD  Cardiologist:  Jenean Lindau, MD   Referring MD: Leonides Sake, MD    ASSESSMENT:    1. Essential hypertension   2. Paroxysmal atrial fibrillation (HCC)   3. Aortic atherosclerosis ([place])   4. Ascending aorta dilatation (HCC)   5. OSA (obstructive sleep apnea)   6. Dyslipidemia   7. Obesity (BMI 35.0-39.9 without comorbidity)    PLAN:    In order of problems listed above:  Primary prevention stressed with the patient.  Importance of compliance with diet medication stressed any vocalized understanding. Dyspnea on exertion: This could be multifactorial.  I would like to do CT coronary angiography with FFR.  He has multiple risk factors for coronary artery disease.  He leads a sedentary lifestyle.  Also this will help me guide antiarrhythmic therapy with flecainide.  Also ascending aorta will be assessed on this study in view of aneurysm. Ascending aortic aneurysm: Stable.  Asymptomatic.  As mentioned above. Paroxysmal atrial fibrillation: He feels that he may be having paroxysms.  Will do a 1 month monitor.  He does not meet the CHA2DS2-VASc score for anticoagulation. Mixed dyslipidemia: Lipids reviewed from [facility] sheet and discussed with the patient at length. Obesity: Weight reduction stressed.  Diet emphasized and he promises to do better. Elevated blood pressure without diagnosis of hypertension.  Patient has multiple blood pressure readings from home and also from his visit to primary care.  He does not have a history of hypertension this is an isolated reading.  And he keeps a track of this. Patient will be seen in follow-up appointment in 6 months or earlier if the patient has any concerns    Medication Adjustments/Labs and Tests Ordered: Current medicines are reviewed at length with the patient today.  Concerns regarding medicines  are outlined above.  No orders of the defined types were placed in this encounter.  No orders of the defined types were placed in this encounter.    No chief complaint on file.    History of Present Illness:    Jon Ortiz is a 67 y.o. male.  Patient has past medical history of aortic atherosclerosis, ascending aortic dilatation, dyslipidemia, essential hypertension sleep apnea and obesity.  He has paroxysmal atrial fibrillation.  He denies any problems at this time and takes care of activities of daily living.  No chest pain orthopnea or PND.  At the time of my evaluation, the patient is alert awake oriented and in no distress.  Past Medical History:  Diagnosis Date   A-fib [facility])    Aortic atherosclerosis ([place]) 10/07/2020   Ascending aorta dilatation (HCC) 01/07/2019   Dyslipidemia 03/29/2017   Essential hypertension 03/29/2017   High risk medication use 12/12/2018   Hyperlipidemia    Hypertension    OSA (obstructive sleep apnea) 05/28/2015   Paroxysmal atrial fibrillation ([place]) 01/05/2015    Past Surgical History:  Procedure Laterality Date   APPENDECTOMY     HAMMER TOE SURGERY Right    TONSILLECTOMY      Current Medications: Current Meds  Medication Sig   aspirin EC 81 MG tablet Take 1 tablet (81 mg total) by mouth daily.   cetirizine (ZYRTEC) 10 MG tablet Take 10 mg by mouth daily.   Coenzyme Q10 (CO Q-10) 200 MG CAPS Take 200 mg by mouth daily.   flecainide (  TAMBOCOR) 150 MG tablet Take 1 tablet (150 mg total) by mouth 2 (two) times daily. Patient needs appointment for further refills. 1 st attempt   ketoconazole (NIZORAL) 2 % cream Apply 1 application topically at bedtime.   meloxicam (MOBIC) 15 MG tablet Take 15 mg by mouth as needed for pain.   metoprolol succinate (TOPROL-XL) 25 MG 24 hr tablet Take 1 tablet (25 mg total) by mouth daily. Patient must keep appointment for 05/12/22 for further refills. 3 rd/final attempt   Multiple Vitamin  (MULTI-VITAMINS) TABS Take 3 tablets by mouth daily.    RA KRILL OIL 500 MG CAPS Take 1 capsule by mouth daily.   rosuvastatin (CRESTOR) 10 MG tablet Take 1 tablet (10 mg total) by mouth daily. Patient must keep appointment for 05/12/22 for further refills. 3 rd/final attempt   triamterene-hydrochlorothiazide (MAXZIDE-25) 37.5-25 MG tablet Take 1 tablet by mouth daily. Patient must keep appointment for 05/12/22 for further refills. 3 rd/final attempt     Allergies:   Patient has no known allergies.   Social History   Socioeconomic History   Marital status: Married    Spouse name: Not on file   Number of children: Not on file   Years of education: Not on file   Highest education level: Not on file  Occupational History   Not on file  Tobacco Use   Smoking status: Former   Smokeless tobacco: Never  Vaping Use   Vaping Use: Never used  Substance and Sexual Activity   Alcohol use: No   Drug use: No   Sexual activity: Not on file  Other Topics Concern   Not on file  Social History Narrative   Not on file   Social Determinants of Health   Financial Resource Strain: Not on file  Food Insecurity: Not on file  Transportation Needs: Not on file  Physical Activity: Not on file  Stress: Not on file  Social Connections: Not on file     Family History: The patient's family history includes Arrhythmia in his father; Heart disease in his father; Valvular heart disease in his mother.  ROS:   Please see the history of present illness.    All other systems reviewed and are negative.  EKGs/Labs/Other Studies Reviewed:    The following studies were reviewed today: EKG reveals sinus rhythm first-degree AV block and nonspecific ST-T changes   Recent Labs: No results found for requested labs within last 365 days.  Recent Lipid Panel No results found for: "CHOL", "TRIG", "HDL", "CHOLHDL", "VLDL", "LDLCALC", "LDLDIRECT"  Physical Exam:    VS:  BP (!) 166/76   Pulse (!) 53   Ht 6'  4" (1.93 m)   Wt (!) 305 lb 1.3 oz (138.4 kg)   SpO2 95%   BMI 37.14 kg/m     Wt Readings from Last 3 Encounters:  05/12/22 (!) 305 lb 1.3 oz (138.4 kg)  03/24/21 290 lb 1.3 oz (131.6 kg)  10/07/20 295 lb 1.9 oz (133.9 kg)     GEN: Patient is in no acute distress HEENT: Normal NECK: No JVD; No carotid bruits LYMPHATICS: No lymphadenopathy CARDIAC: Hear sounds regular, 2/6 systolic murmur at the apex. RESPIRATORY:  Clear to auscultation without rales, wheezing or rhonchi  ABDOMEN: Soft, non-tender, non-distended MUSCULOSKELETAL:  No edema; No deformity  SKIN: Warm and dry NEUROLOGIC:  Alert and oriented x 3 PSYCHIATRIC:  Normal affect   Signed, Jenean Lindau, MD  05/12/2022 4:20 PM    [facility]

## 2022-05-12 NOTE — Patient Instructions (Signed)
Medication Instructions:  Your physician recommends that you continue on your current medications as directed. Please refer to the Current Medication list given to you today.   *If you need a refill on your cardiac medications before your next appointment, please call your pharmacy*   Lab Work: Your physician recommends that you return for lab work in: the next few days for [place] lab is located on the 3rd floor, [address]. Hours are Monday - Friday 8 am to 4 pm, closed 11:30 am to 1:00 pm. You do NOT need an appointment.    If you have labs (blood work) drawn today and your tests are completely normal, you will receive your results only by: [place] (if you have MyChart) OR A paper copy in the mail If you have any lab test that is abnormal or we need to change your treatment, we will call you to review the results.   Testing/Procedures:   Your cardiac CT will be scheduled at one of the below locations:   [facility] [address] [place], [place] [address] [phone]  If scheduled at [facility], please arrive at the [facility] and Children's Entrance (Entrance C2) of [facility] 30 minutes prior to test start time. You can use the FREE valet parking offered at entrance C (encouraged to control the heart rate for the test)  Proceed to the [facility] Radiology Department (first floor) to check-in and test prep.  All radiology patients and guests should use entrance C2 at [facility], accessed from [facility], even though the hospital's physical address listed is [address]     Please follow these instructions carefully (unless otherwise directed):  Hold all erectile dysfunction medications at least 3 days (72 hrs) prior to test. (Ie viagra, cialis, sildenafil, tadalafil, etc) We will administer nitroglycerin during this exam.   On the Night Before the Test: Be sure to Drink plenty of water. Do not  consume any caffeinated/decaffeinated beverages or chocolate 12 hours prior to your test. Do not take any antihistamines 12 hours prior to your test.  On the Day of the Test: Drink plenty of water until 1 hour prior to the test. Do not eat any food 1 hour prior to test. You may take your regular medications prior to the test.  Take metoprolol (Lopressor) two hours prior to test.  After the Test: Drink plenty of water. After receiving IV contrast, you may experience a mild flushed feeling. This is normal. On occasion, you may experience a mild rash up to 24 hours after the test. This is not dangerous. If this occurs, you can take Benadryl 25 mg and increase your fluid intake. If you experience trouble breathing, this can be serious. If it is severe call 911 IMMEDIATELY. If it is mild, please call our office. If you take any of these medications: Glipizide/Metformin, Avandament, Glucavance, please do not take 48 hours after completing test unless otherwise instructed.  We will call to schedule your test 2-4 weeks out understanding that some insurance companies will need an authorization prior to the service being performed.   For non-scheduling related questions, please contact the cardiac imaging nurse navigator should you have any questions/concerns: Marchia Bond, Cardiac Imaging Nurse Navigator Gordy Clement, Cardiac Imaging Nurse Navigator [facility] Direct Office Dial: [phone]   For scheduling needs, including cancellations and rescheduling, please call [place], [phone].   Your physician has recommended that you wear  an event monitor. Event monitors are medical devices that record the heart's electrical activity. Doctors most often [place] these monitors to diagnose arrhythmias. Arrhythmias are problems with the speed or rhythm of the heartbeat. The monitor is a small, portable device. You can wear one while you do your normal daily activities. This is  usually used to diagnose what is causing palpitations/syncope (passing out). This is a 30 day monitor.  Your next appointment:   6 month(s)  The format for your next appointment:   In Person  Provider:   Jyl Heinz, MD   Other Instructions Cardiac CT Angiogram A cardiac CT angiogram is a procedure to look at the heart and the area around the heart. It may be done to help find the cause of chest pains or other symptoms of heart disease. During this procedure, a substance called contrast dye is injected into the blood vessels in the area to be checked. A large X-ray machine, called a CT scanner, then takes detailed pictures of the heart and the surrounding area. The procedure is also sometimes called a coronary CT angiogram, coronary artery scanning, or CTA. A cardiac CT angiogram allows the health care provider to see how well blood is flowing to and from the heart. The health care provider will be able to see if there are any problems, such as: Blockage or narrowing of the coronary arteries in the heart. Fluid around the heart. Signs of weakness or disease in the muscles, valves, and tissues of the heart. Tell a health care provider about: Any allergies you have. This is especially important if you have had a previous allergic reaction to contrast dye. All medicines you are taking, including vitamins, herbs, eye drops, creams, and over-the-counter medicines. Any blood disorders you have. Any surgeries you have had. Any medical conditions you have. Whether you are pregnant or may be pregnant. Any anxiety disorders, chronic pain, or other conditions you have that may increase your stress or prevent you from lying still. What are the risks? Generally, this is a safe procedure. However, problems may occur, including: Bleeding. Infection. Allergic reactions to medicines or dyes. Damage to other structures or organs. Kidney damage from the contrast dye that is used. Increased risk of  cancer from radiation exposure. This risk is low. Talk with your health care provider about: The risks and benefits of testing. How you can receive the lowest dose of radiation. What happens before the procedure? Wear comfortable clothing and remove any jewelry, glasses, dentures, and hearing aids. Follow instructions from your health care provider about eating and drinking. This may include: For 12 hours before the procedure -- avoid caffeine. This includes tea, coffee, soda, energy drinks, and diet pills. Drink plenty of water or other fluids that do not have caffeine in them. Being well hydrated can prevent complications. For 4-6 hours before the procedure -- stop eating and drinking. The contrast dye can cause nausea, but this is less likely if your stomach is empty. Ask your health care provider about changing or stopping your regular medicines. This is especially important if you are taking diabetes medicines, blood thinners, or medicines to treat problems with erections (erectile dysfunction). What happens during the procedure?  Hair on your chest may need to be removed so that small sticky patches called electrodes can be placed on your chest. These will transmit information that helps to monitor your heart during the procedure. An IV will be inserted into one of your veins. You might be given a  medicine to control your heart rate during the procedure. This will help to ensure that good images are obtained. You will be asked to lie on an exam table. This table will slide in and out of the CT machine during the procedure. Contrast dye will be injected into the IV. You might feel warm, or you may get a metallic taste in your mouth. You will be given a medicine called nitroglycerin. This will relax or dilate the arteries in your heart. The table that you are lying on will move into the CT machine tunnel for the scan. The person running the machine will give you instructions while the scans are  being done. You may be asked to: Keep your arms above your head. Hold your breath. Stay very still, even if the table is moving. When the scanning is complete, you will be moved out of the machine. The IV will be removed. The procedure may vary among health care providers and hospitals. What can I expect after the procedure? After your procedure, it is common to have: A metallic taste in your mouth from the contrast dye. A feeling of warmth. A headache from the nitroglycerin. Follow these instructions at home: Take over-the-counter and prescription medicines only as told by your health care provider. If you are told, drink enough fluid to keep your urine pale yellow. This will help to flush the contrast dye out of your body. Most people can return to their normal activities right after the procedure. Ask your health care provider what activities are safe for you. It is up to you to get the results of your procedure. Ask your health care provider, or the department that is doing the procedure, when your results will be ready. Keep all follow-up visits as told by your health care provider. This is important. Contact a health care provider if: You have any symptoms of allergy to the contrast dye. These include: Shortness of breath. Rash or hives. A racing heartbeat. Summary A cardiac CT angiogram is a procedure to look at the heart and the area around the heart. It may be done to help find the cause of chest pains or other symptoms of heart disease. During this procedure, a large X-ray machine, called a CT scanner, takes detailed pictures of the heart and the surrounding area after a contrast dye has been injected into blood vessels in the area. Ask your health care provider about changing or stopping your regular medicines before the procedure. This is especially important if you are taking diabetes medicines, blood thinners, or medicines to treat erectile dysfunction. If you are told, drink  enough fluid to keep your urine pale yellow. This will help to flush the contrast dye out of your body. This information is not intended to replace advice given to you by your health care provider. Make sure you discuss any questions you have with your health care provider. Document Revised: 10/24/2018 Document Reviewed: 10/24/2018 Elsevier Patient Education  [place].

## 2022-05-12 NOTE — Addendum Note (Signed)
Addended by: Truddie Hidden on: 05/12/2022 05:48 PM   Modules accepted: Orders

## 2022-05-18 ENCOUNTER — Telehealth (HOSPITAL_COMMUNITY): Payer: Self-pay | Admitting: *Deleted

## 2022-05-18 NOTE — Telephone Encounter (Signed)
Patient calling about his upcoming cardiac imaging study; pt verbalizes understanding of appt date/time, parking situation and where to check in, pre-test NPO status  and verified current allergies; name and call back number provided for further questions should they arise  Gordy Clement RN Navigator Cardiac Imaging [facility] [phone] office [phone] cell  Patient currently denies Afib but is aware we may abort scan if he has an episode of Afib during his scan. He will take his daily medications and is aware to arrive at 12pm.

## 2022-05-18 NOTE — Addendum Note (Signed)
Addended by: Truddie Hidden on: 05/18/2022 12:43 PM   Modules accepted: Orders

## 2022-05-23 ENCOUNTER — Ambulatory Visit (HOSPITAL_COMMUNITY)
Admission: RE | Admit: 2022-05-23 | Discharge: 2022-05-23 | Disposition: A | Discharge status: Home / Self Care | Payer: BC Managed Care – PPO | Source: Ambulatory Visit | Attending: Cardiology | Admitting: Cardiology

## 2022-05-23 ENCOUNTER — Telehealth: Payer: Self-pay

## 2022-05-23 DIAGNOSIS — I77810 Thoracic aortic ectasia: Secondary | ICD-10-CM | POA: Diagnosis not present

## 2022-05-23 DIAGNOSIS — I700 Atherosclerosis of aorta: Secondary | ICD-10-CM

## 2022-05-23 DIAGNOSIS — R0609 Other forms of dyspnea: Secondary | ICD-10-CM | POA: Diagnosis present

## 2022-05-23 MED ORDER — IOHEXOL 350 MG/ML SOLN
95.0000 mL | Freq: Once | INTRAVENOUS | Status: AC | PRN
  Administered 2022-05-23: 95 mL via INTRAVENOUS

## 2022-05-23 MED ORDER — NITROGLYCERIN 0.4 MG SL SUBL
0.8000 mg | SUBLINGUAL_TABLET | Freq: Once | SUBLINGUAL | Status: AC
  Administered 2022-05-23: 0.8 mg via SUBLINGUAL

## 2022-05-23 MED ORDER — NITROGLYCERIN 0.4 MG SL SUBL
SUBLINGUAL_TABLET | SUBLINGUAL | Status: AC
  Filled 2022-05-23: qty 2

## 2022-05-23 NOTE — Telephone Encounter (Signed)
   Patient Name: Jon Ortiz  DOB: March 13, 1956 MRN: [id number]  Primary Cardiologist: Jenean Lindau, MD  Chart reviewed as part of pre-operative protocol coverage.   Regarding ASA therapy, we recommend continuation of ASA throughout the perioperative period.  However, if the surgeon feels that cessation of ASA is required in the perioperative period, it may be stopped 5-7 days prior to surgery with a plan to resume it as soon as felt to be feasible from a surgical standpoint in the post-operative period.      Mable Fill, Marissa Nestle, NP 05/23/2022, 9:41 AM

## 2022-05-23 NOTE — Telephone Encounter (Signed)
   Pre-operative Risk Assessment    Patient Name: Jon Ortiz  DOB: 1955/10/14 MRN: [id number]      Request for Surgical Clearance    Procedure:   Left Knee Scope PMM, Chondroplasty  Date of Surgery:  Clearance TBD                                 Surgeon:  Dr Alfonso Patten. Hart Robinsons Surgeon's Group or Practice Name:  [facility]  Phone number:  [phone] Fax number:  [phone] Kerri Maze   Type of Clearance Requested:   - Pharmacy:  Hold Aspirin     Type of Anesthesia:  General with Knee block   Additional requests/questions:  N/A  SignedJamelle Haring   05/23/2022, 8:53 AM

## 2022-06-27 ENCOUNTER — Telehealth: Payer: Self-pay

## 2022-06-27 NOTE — Telephone Encounter (Signed)
   Pre-operative Risk Assessment    Patient Name: Jon Ortiz  DOB: 03/21/1955 MRN: [id number]      Request for Surgical Clearance    Procedure:   colonoscopy  Date of Surgery:  Clearance 08/03/22                                 Surgeon:  Dr. Marcial Pacas [place] Surgeon's Group or Practice Name:   [facility] Phone number:  [phone] Fax number:  [phone]   Type of Clearance Requested:   - Pharmacy:  Hold Aspirin May 17 th   Type of Anesthesia:  Not Indicated   Additional requests/questions:    Merlene Laughter   06/27/2022, 11:59 AM

## 2022-06-27 NOTE — Telephone Encounter (Signed)
   Patient Name: Jon Ortiz  DOB: 07-05-1955 MRN: [id number]  Primary Cardiologist: Garwin Brothers, MD  Chart reviewed as part of pre-operative protocol coverage. Pre-op clearance already addressed by colleagues in earlier phone notes. To summarize recommendations:  -No documented history of CAD. Patient should not need to hold ASA for colonoscopy since it is a low-risk bleeding procedure, but if it needs to be held then can hold x 5 days prior to the procedure and resume when medically safe to do so.   Will route this bundled recommendation to requesting provider via Epic fax function and remove from pre-op pool. Please call with questions.  Sharlene Dory, PA-C 06/27/2022, 12:45 PM

## 2023-03-20 DIAGNOSIS — E785 Hyperlipidemia, unspecified: Secondary | ICD-10-CM | POA: Diagnosis not present

## 2023-03-20 DIAGNOSIS — Z01812 Encounter for preprocedural laboratory examination: Secondary | ICD-10-CM | POA: Diagnosis not present

## 2023-03-20 DIAGNOSIS — Z01810 Encounter for preprocedural cardiovascular examination: Secondary | ICD-10-CM | POA: Diagnosis not present

## 2023-03-20 DIAGNOSIS — R918 Other nonspecific abnormal finding of lung field: Secondary | ICD-10-CM | POA: Diagnosis not present

## 2023-03-20 DIAGNOSIS — I10 Essential (primary) hypertension: Secondary | ICD-10-CM | POA: Diagnosis not present

## 2023-03-20 DIAGNOSIS — G4733 Obstructive sleep apnea (adult) (pediatric): Secondary | ICD-10-CM | POA: Diagnosis not present

## 2023-03-20 DIAGNOSIS — J9811 Atelectasis: Secondary | ICD-10-CM | POA: Diagnosis not present

## 2023-03-20 DIAGNOSIS — I480 Paroxysmal atrial fibrillation: Secondary | ICD-10-CM | POA: Diagnosis not present

## 2023-03-20 DIAGNOSIS — R001 Bradycardia, unspecified: Secondary | ICD-10-CM | POA: Diagnosis not present

## 2023-03-20 DIAGNOSIS — I77810 Thoracic aortic ectasia: Secondary | ICD-10-CM | POA: Diagnosis not present

## 2023-03-20 DIAGNOSIS — Z01818 Encounter for other preprocedural examination: Secondary | ICD-10-CM | POA: Diagnosis not present

## 2023-03-22 DIAGNOSIS — I4891 Unspecified atrial fibrillation: Secondary | ICD-10-CM | POA: Diagnosis not present

## 2023-03-22 DIAGNOSIS — E785 Hyperlipidemia, unspecified: Secondary | ICD-10-CM | POA: Diagnosis not present

## 2023-03-22 DIAGNOSIS — G4733 Obstructive sleep apnea (adult) (pediatric): Secondary | ICD-10-CM | POA: Diagnosis not present

## 2023-03-22 DIAGNOSIS — R001 Bradycardia, unspecified: Secondary | ICD-10-CM | POA: Diagnosis not present

## 2023-03-22 DIAGNOSIS — I340 Nonrheumatic mitral (valve) insufficiency: Secondary | ICD-10-CM | POA: Diagnosis not present

## 2023-03-22 DIAGNOSIS — I480 Paroxysmal atrial fibrillation: Secondary | ICD-10-CM | POA: Diagnosis not present

## 2023-03-22 DIAGNOSIS — E669 Obesity, unspecified: Secondary | ICD-10-CM | POA: Diagnosis not present

## 2023-03-22 DIAGNOSIS — I77810 Thoracic aortic ectasia: Secondary | ICD-10-CM | POA: Diagnosis not present

## 2023-03-22 DIAGNOSIS — I10 Essential (primary) hypertension: Secondary | ICD-10-CM | POA: Diagnosis not present

## 2023-03-22 DIAGNOSIS — K219 Gastro-esophageal reflux disease without esophagitis: Secondary | ICD-10-CM | POA: Diagnosis not present

## 2023-04-06 DIAGNOSIS — Z8679 Personal history of other diseases of the circulatory system: Secondary | ICD-10-CM | POA: Diagnosis not present

## 2023-04-06 DIAGNOSIS — I480 Paroxysmal atrial fibrillation: Secondary | ICD-10-CM | POA: Diagnosis not present

## 2023-04-06 DIAGNOSIS — Z9889 Other specified postprocedural states: Secondary | ICD-10-CM | POA: Diagnosis not present

## 2023-04-24 DIAGNOSIS — R001 Bradycardia, unspecified: Secondary | ICD-10-CM | POA: Diagnosis not present

## 2023-04-24 DIAGNOSIS — Z9889 Other specified postprocedural states: Secondary | ICD-10-CM | POA: Diagnosis not present

## 2023-04-24 DIAGNOSIS — I480 Paroxysmal atrial fibrillation: Secondary | ICD-10-CM | POA: Diagnosis not present

## 2023-04-24 DIAGNOSIS — Z8679 Personal history of other diseases of the circulatory system: Secondary | ICD-10-CM | POA: Diagnosis not present

## 2023-04-24 DIAGNOSIS — Z1330 Encounter for screening examination for mental health and behavioral disorders, unspecified: Secondary | ICD-10-CM | POA: Diagnosis not present

## 2023-04-24 DIAGNOSIS — I77810 Thoracic aortic ectasia: Secondary | ICD-10-CM | POA: Diagnosis not present

## 2023-04-24 DIAGNOSIS — I495 Sick sinus syndrome: Secondary | ICD-10-CM | POA: Diagnosis not present

## 2023-04-24 DIAGNOSIS — I455 Other specified heart block: Secondary | ICD-10-CM | POA: Diagnosis not present

## 2023-04-24 DIAGNOSIS — G4733 Obstructive sleep apnea (adult) (pediatric): Secondary | ICD-10-CM | POA: Diagnosis not present

## 2023-05-03 DIAGNOSIS — H52203 Unspecified astigmatism, bilateral: Secondary | ICD-10-CM | POA: Diagnosis not present

## 2023-05-03 DIAGNOSIS — H524 Presbyopia: Secondary | ICD-10-CM | POA: Diagnosis not present

## 2023-05-03 DIAGNOSIS — H5203 Hypermetropia, bilateral: Secondary | ICD-10-CM | POA: Diagnosis not present

## 2023-05-03 DIAGNOSIS — H2513 Age-related nuclear cataract, bilateral: Secondary | ICD-10-CM | POA: Diagnosis not present

## 2023-05-03 DIAGNOSIS — H40033 Anatomical narrow angle, bilateral: Secondary | ICD-10-CM | POA: Diagnosis not present

## 2023-05-04 DIAGNOSIS — M25562 Pain in left knee: Secondary | ICD-10-CM | POA: Diagnosis not present

## 2023-05-08 DIAGNOSIS — M25562 Pain in left knee: Secondary | ICD-10-CM | POA: Diagnosis not present

## 2023-05-17 ENCOUNTER — Telehealth: Payer: Self-pay

## 2023-05-17 DIAGNOSIS — Z1331 Encounter for screening for depression: Secondary | ICD-10-CM | POA: Diagnosis not present

## 2023-05-17 DIAGNOSIS — I480 Paroxysmal atrial fibrillation: Secondary | ICD-10-CM | POA: Diagnosis not present

## 2023-05-17 DIAGNOSIS — I10 Essential (primary) hypertension: Secondary | ICD-10-CM

## 2023-05-17 DIAGNOSIS — I252 Old myocardial infarction: Secondary | ICD-10-CM | POA: Diagnosis not present

## 2023-05-17 DIAGNOSIS — Z139 Encounter for screening, unspecified: Secondary | ICD-10-CM | POA: Diagnosis not present

## 2023-05-17 DIAGNOSIS — Z9181 History of falling: Secondary | ICD-10-CM | POA: Diagnosis not present

## 2023-05-17 MED ORDER — TRIAMTERENE-HCTZ 37.5-25 MG PO TABS: 1.0000 | ORAL_TABLET | Freq: Every day | ORAL | 1 refills | Status: DC

## 2023-05-17 NOTE — Telephone Encounter (Signed)
 Medication sent.

## 2023-05-22 DIAGNOSIS — G4733 Obstructive sleep apnea (adult) (pediatric): Secondary | ICD-10-CM | POA: Diagnosis not present

## 2023-05-22 DIAGNOSIS — I455 Other specified heart block: Secondary | ICD-10-CM | POA: Diagnosis not present

## 2023-05-22 DIAGNOSIS — E785 Hyperlipidemia, unspecified: Secondary | ICD-10-CM | POA: Diagnosis not present

## 2023-05-22 DIAGNOSIS — I495 Sick sinus syndrome: Secondary | ICD-10-CM | POA: Diagnosis not present

## 2023-05-22 DIAGNOSIS — I10 Essential (primary) hypertension: Secondary | ICD-10-CM | POA: Diagnosis not present

## 2023-05-22 DIAGNOSIS — I480 Paroxysmal atrial fibrillation: Secondary | ICD-10-CM | POA: Diagnosis not present

## 2023-05-22 DIAGNOSIS — R001 Bradycardia, unspecified: Secondary | ICD-10-CM | POA: Diagnosis not present

## 2023-05-22 DIAGNOSIS — Z9889 Other specified postprocedural states: Secondary | ICD-10-CM | POA: Diagnosis not present

## 2023-05-22 DIAGNOSIS — I77810 Thoracic aortic ectasia: Secondary | ICD-10-CM | POA: Diagnosis not present

## 2023-06-01 DIAGNOSIS — L570 Actinic keratosis: Secondary | ICD-10-CM | POA: Diagnosis not present

## 2023-06-01 DIAGNOSIS — Z872 Personal history of diseases of the skin and subcutaneous tissue: Secondary | ICD-10-CM | POA: Diagnosis not present

## 2023-06-01 DIAGNOSIS — Z7189 Other specified counseling: Secondary | ICD-10-CM | POA: Diagnosis not present

## 2023-06-01 DIAGNOSIS — B353 Tinea pedis: Secondary | ICD-10-CM | POA: Diagnosis not present

## 2023-06-01 DIAGNOSIS — L739 Follicular disorder, unspecified: Secondary | ICD-10-CM | POA: Diagnosis not present

## 2023-06-01 DIAGNOSIS — L821 Other seborrheic keratosis: Secondary | ICD-10-CM | POA: Diagnosis not present

## 2023-06-01 DIAGNOSIS — L2089 Other atopic dermatitis: Secondary | ICD-10-CM | POA: Diagnosis not present

## 2023-06-01 DIAGNOSIS — L218 Other seborrheic dermatitis: Secondary | ICD-10-CM | POA: Diagnosis not present

## 2023-06-18 NOTE — H&P (Signed)
 Jon Ortiz 05/22/2023 2:30 PM   Office Visit MRN: [id number] Description: Male DOB: 03/20/1955 Provider: Sherron DELENA Champ, MD Department: [facility] Encounter #: [id number]   Referring Provider  Charlene LITTIE Single, MD        Diagnoses   Codes Comments  Sinus pause  - Primary I45.5   PAF (paroxysmal atrial fibrillation)  (*) I48.0   Bradycardia R00.1   Status post ablation of atrial fibrillation Z98.890, Z86.79   Sinus node dysfunction  (*) I49.5   Ascending aorta dilatation I77.810   Hyperlipidemia, unspecified hyperlipidemia type E78.5   Primary hypertension I10   OSA (obstructive sleep apnea) G47.33         Reason for Visit  Reason for Visit History <redacted file path>     Progress Notes  Jon JAYSON Raymond, PA-C at 05/22/23 1428  Status: Attested Cosigner: Sherron DELENA Champ, MD at 05/22/23 1612     Attestation signed by Sherron DELENA Champ, MD at 05/22/23 1612   I certify that I have reviewed the above note entered by the PA/NP/student.  I have participated in the formulation of the plan and have reviewed the anticipated medical care.  I agree with the decision making and the care plans as indicated in the note. I personally examined this patient with the extender. The following joint plan was formulated. After much discussion, I believe we have determined to place a pacemaker to allow better medical suppression of his atrial arrhythmias.  If this remains unacceptable for the long-term perhaps AV node ablation will be considered.  Based on this we will place a CRT device to allow for this possibility.        Expand All Collapse All                                                                 Return Office Visit   05/22/2023   Visit Problem List:   1. Sinus pause        2. PAF (paroxysmal atrial fibrillation)  (*)        3. Bradycardia        4. Status post ablation of atrial fibrillation        5. Sinus node  dysfunction  (*)        6. Ascending aorta dilatation        7. Hyperlipidemia, unspecified hyperlipidemia type        8. Primary hypertension        9. OSA (obstructive sleep apnea)            Chronic Problem List:   Problem List     Patient Active Problem List  Diagnosis  . PAF (paroxysmal atrial fibrillation)  (*)  . Primary hypertension  . HLD (hyperlipidemia)  . Ascending aorta dilatation  . OSA (obstructive sleep apnea)  . Bradycardia  . Status post ablation of atrial fibrillation        Medications:  Current Medications    Current Outpatient Medications:  .  apixaban (ELIQUIS) 5 mg tablet, Take one tablet (5 mg dose) by mouth 2 (two) times daily., Disp: 180 tablet, Rfl: 1 .  cetirizine (ZYRTEC) 10 mg tablet, Take one tablet (10 mg dose) by mouth  daily., Disp: , Rfl:  .  ciclopirox (LOPROX) 0.77% SUSP, Apply onto the face twice daily as directed. Safe for long term use., Disp: , Rfl:  .  Coenzyme Q10 (CO Q10) 30 MG CAPS, Take 200 mg by mouth daily., Disp: , Rfl:  .  diltiazem HCl (DILT-XR,DILACOR XR) 180 mg 24 hr capsule, Take one capsule (180 mg dose) by mouth daily., Disp: 90 capsule, Rfl: 1 .  flecainide  acetate (TAMBOCOR ) 100 mg tablet, Take one half tablet (50 mg dose) by mouth 2 (two) times daily., Disp: 60 tablet, Rfl: 5 .  fluorouracil (EFUDEX) 5 % cream, , Disp: , Rfl:  .  ketoconazole (NIZORAL) 2% shampoo, Apply topically daily. :Lather onto scalp daily. Leave on for 3-5 minutes, then rinse. Safe for long term use., Disp: , Rfl:  .  Krill Oil (OMEGA-3) 500 MG CAPS, Take 1 capsule by mouth daily., Disp: , Rfl:  .  lisinopril (PRINIVIL,ZESTRIL) 5 mg tablet, Take one tablet (5 mg dose) by mouth daily., Disp: 90 tablet, Rfl: 6 .  metoprolol  succinate (TOPROL -XL) 25 mg 24 hr tablet, Take one tablet (25 mg dose) by mouth 2 (two) times daily. (Patient not taking: Reported on 05/22/2023), Disp: 180 tablet, Rfl: 1 .  Multiple Vitamin (MULTI-VITAMINS) tablet, Take one  tablet by mouth daily., Disp: , Rfl:  .  rosuvastatin  calcium  (CRESTOR ) 10 mg tablet, Take one tablet (10 mg dose) by mouth daily., Disp: 90 tablet, Rfl: 6 .  triamterene -hydrochlorothiazide (MAXZIDE-25) 37.5-25 mg per tablet, Take one tablet by mouth daily., Disp: 90 tablet, Rfl: 6 .  WEGOVY 2.4 MG/0.75ML SOAJ injection, once a week., Disp: , Rfl:      Interval History: Jon Ortiz presents to clinic for follow-up regarding his history of persistent atrial fibrillation status post PVI with cryoablation (08/2022) with recurrence and subsequent PVI using pulsed field ablation (03/2023) and bradycardia in the low 30s and sinus pauses up to 6.5 seconds during daytime hours. Patient reports intermittent episodes of atrial fibrillation with heart rates in the 120-140 bpm which made him decide to resume flecainide  suppression. Flecainide  was discontinued during prior visit due to bradycardia with long pauses. He has tried 50 mg and 100 mg of flecainide . We discussed medication steady state and half life. Recommended a steady dose for better suppression. Given his sinus node dysfunction, pacemaker implantation was discussed to help control his heart rhythm without concerns of bradycardia and pauses. Procedure and small risks discussed. Consideration of AV node ablation may be given after pacemaker.    ROS General - weight stable, appetite normal; no fever HEENT- vision stable, hearing normal; no difficulty swallowing Endocrine - thyroid stable Resp - denies cough, hemoptysis, purulent sputum Abdomen - denies belly pain, swelling; denies liver disease or jaundice GI - no hematemesis, melena. Normal bowel movements. No Crohns/UC history GU - no dysuria, hematuria, pyuria. No UTI or kidney stone. Neuro - no slurred speech, blurred vision; denies amaurosis or sided weakness Skin - no rash, bruising, petechia Hematologic - no recent bleeding or easy bruising Allergy - denies food allergy; drug  allergies as noted Psych - No depression, normal sleep   Physical Examination Vitals     Vitals:    05/22/23 1415  BP: 124/68  Pulse: 74  SpO2: 98%  Weight: 271 lb (122.9 kg)  Height: 6' 4 (1.93 m)      General - Pt. in NAD, normal demeanor, speech normal, oriented normally HEENT - PERRLA, EOMs intact, nares and throat clear Neck -  neither supple nor no adenopathy nor thyroid normal in size, no nodules or tenderness nor carotids normal upstroke, no bruits Lungs - chest clear, no wheezing, rales, normal symmetric air entry, no chest wall deformities or tenderness Heart - regular rate and rhythm, S1, S2 normal, no murmur, click, rub or gallop Abdomen - soft, bowel sounds positive, no organomegaly Extrem - none Skin - no rash, bruising or petechia Back - no CVA tenderness Neuro - strength is symmetric in upper and lower extremities; reflexes normal, speech normal, face symmetric Psych - affect appropriate   Assessment :    Sinus node dysfunction Sinus pauses up to 6.5 seconds Bradycardia Persistent atrial fibrillation Status post recent PVI using pulsed field ablation History of PVI with cryoablation 08/2022 Sleep apnea on CPAP therapy   Plan:  The patient will be brought to the hospital to undergo pacemaker placement.  Patient understands procedure and associated risks and is in agreement with plan. Continue current medications  Patient is advised to visit ED or contact this clinic should any significant or worsening cardiac symptoms occur before next visit.      Follow up visit:  3 months     Jon Raymond, PA-C [facility]   Addendum : H an P reviewed 06/18/23. Patient ahs decided to proceed wih PPM. Risks were explained.      Electronically signed by Sherron DELENA Champ, MD at 05/22/23 1612   Questionnaires  No completed forms available for this encounter.   Vital Signs Most recent update: 05/22/2023  2:17 PM BP  124/68    Pulse   74    Ht  6' 4 (1.93 m)    Wt  271 lb (122.9 kg)    SpO2  98%

## 2023-06-23 NOTE — H&P (Signed)
 Jon Ortiz 05/22/2023 2:30 PM   Office Visit MRN: [id number] Description: Male DOB: [birth year] Provider: Sherron DELENA Champ, MD Department: [facility] [place] Encounter #: [id number]   Referring Provider  Jon LITTIE Single, MD        Diagnoses   Codes Comments  Sinus pause  - Primary I45.5   PAF (paroxysmal atrial fibrillation)  (*) I48.0   Bradycardia R00.1   Status post ablation of atrial fibrillation Z98.890, Z86.79   Sinus node dysfunction  (*) I49.5   Ascending aorta dilatation I77.810   Hyperlipidemia, unspecified hyperlipidemia type E78.5   Primary hypertension I10   OSA (obstructive sleep apnea) G47.33         Reason for Visit  Reason for Visit History <redacted file path>     Progress Notes  Jon JAYSON Raymond, PA-C at 05/22/23 1428  Status: Attested Cosigner: Jon DELENA Champ, MD at 05/22/23 1612     Attestation signed by Jon DELENA Champ, MD at 05/22/23 1612   I certify that I have reviewed the above note entered by the PA/NP/student.  I have participated in the formulation of the plan and have reviewed the anticipated medical care.  I agree with the decision making and the care plans as indicated in the note. I personally examined this patient with the extender. The following joint plan was formulated. After much discussion, I believe we have determined to place a pacemaker to allow better medical suppression of his atrial arrhythmias.  If this remains unacceptable for the long-term perhaps AV node ablation will be considered.  Based on this we will place a CRT device to allow for this possibility.        Expand All Collapse All                                                                 Return Office Visit   05/22/2023   Visit Problem List:   1. Sinus pause        2. PAF (paroxysmal atrial fibrillation)  (*)        3. Bradycardia        4. Status post ablation of atrial fibrillation        5. Sinus node  dysfunction  (*)        6. Ascending aorta dilatation        7. Hyperlipidemia, unspecified hyperlipidemia type        8. Primary hypertension        9. OSA (obstructive sleep apnea)            Chronic Problem List:   Problem List     Patient Active Problem List  Diagnosis  . PAF (paroxysmal atrial fibrillation)  (*)  . Primary hypertension  . HLD (hyperlipidemia)  . Ascending aorta dilatation  . OSA (obstructive sleep apnea)  . Bradycardia  . Status post ablation of atrial fibrillation        Medications:  Current Medications    Current Outpatient Medications:  .  apixaban (ELIQUIS) 5 mg tablet, Take one tablet (5 mg dose) by mouth 2 (two) times daily., Disp: 180 tablet, Rfl: 1 .  cetirizine (ZYRTEC) 10 mg tablet, Take one tablet (10 mg dose) by mouth  daily., Disp: , Rfl:  .  ciclopirox (LOPROX) 0.77% SUSP, Apply onto the face twice daily as directed. Safe for long term use., Disp: , Rfl:  .  Coenzyme Q10 (CO Q10) 30 MG CAPS, Take 200 mg by mouth daily., Disp: , Rfl:  .  diltiazem HCl (DILT-XR,DILACOR XR) 180 mg 24 hr capsule, Take one capsule (180 mg dose) by mouth daily., Disp: 90 capsule, Rfl: 1 .  flecainide  acetate (TAMBOCOR ) 100 mg tablet, Take one half tablet (50 mg dose) by mouth 2 (two) times daily., Disp: 60 tablet, Rfl: 5 .  fluorouracil (EFUDEX) 5 % cream, , Disp: , Rfl:  .  ketoconazole (NIZORAL) 2% shampoo, Apply topically daily. :Lather onto scalp daily. Leave on for 3-5 minutes, then rinse. Safe for long term use., Disp: , Rfl:  .  Krill Oil (OMEGA-3) 500 MG CAPS, Take 1 capsule by mouth daily., Disp: , Rfl:  .  lisinopril (PRINIVIL,ZESTRIL) 5 mg tablet, Take one tablet (5 mg dose) by mouth daily., Disp: 90 tablet, Rfl: 6 .  metoprolol  succinate (TOPROL -XL) 25 mg 24 hr tablet, Take one tablet (25 mg dose) by mouth 2 (two) times daily. (Patient not taking: Reported on 05/22/2023), Disp: 180 tablet, Rfl: 1 .  Multiple Vitamin (MULTI-VITAMINS) tablet, Take one  tablet by mouth daily., Disp: , Rfl:  .  rosuvastatin  calcium  (CRESTOR ) 10 mg tablet, Take one tablet (10 mg dose) by mouth daily., Disp: 90 tablet, Rfl: 6 .  triamterene -hydrochlorothiazide (MAXZIDE-25) 37.5-25 mg per tablet, Take one tablet by mouth daily., Disp: 90 tablet, Rfl: 6 .  WEGOVY 2.4 MG/0.75ML SOAJ injection, once a week., Disp: , Rfl:      Interval History: Jon Ortiz presents to clinic for follow-up regarding his history of persistent atrial fibrillation status post PVI with cryoablation (08/2022) with recurrence and subsequent PVI using pulsed field ablation (03/2023) and bradycardia in the low 30s and sinus pauses up to 6.5 seconds during daytime hours. Patient reports intermittent episodes of atrial fibrillation with heart rates in the 120-140 bpm which made him decide to resume flecainide  suppression. Flecainide  was discontinued during prior visit due to bradycardia with long pauses. He has tried 50 mg and 100 mg of flecainide . We discussed medication steady state and half life. Recommended a steady dose for better suppression. Given his sinus node dysfunction, pacemaker implantation was discussed to help control his heart rhythm without concerns of bradycardia and pauses. Procedure and small risks discussed. Consideration of AV node ablation may be given after pacemaker.    ROS General - weight stable, appetite normal; no fever HEENT- vision stable, hearing normal; no difficulty swallowing Endocrine - thyroid stable Resp - denies cough, hemoptysis, purulent sputum Abdomen - denies belly pain, swelling; denies liver disease or jaundice GI - no hematemesis, melena. Normal bowel movements. No Crohns/UC history GU - no dysuria, hematuria, pyuria. No UTI or kidney stone. Neuro - no slurred speech, blurred vision; denies amaurosis or sided weakness Skin - no rash, bruising, petechia Hematologic - no recent bleeding or easy bruising Allergy - denies food allergy; drug  allergies as noted Psych - No depression, normal sleep   Physical Examination Vitals     Vitals:    05/22/23 1415  BP: 124/68  Pulse: 74  SpO2: 98%  Weight: 271 lb (122.9 kg)  Height: 6' 4 (1.93 m)      General - Pt. in NAD, normal demeanor, speech normal, oriented normally HEENT - PERRLA, EOMs intact, nares and throat clear Neck -  neither supple nor no adenopathy nor thyroid normal in size, no nodules or tenderness nor carotids normal upstroke, no bruits Lungs - chest clear, no wheezing, rales, normal symmetric air entry, no chest wall deformities or tenderness Heart - regular rate and rhythm, S1, S2 normal, no murmur, click, rub or gallop Abdomen - soft, bowel sounds positive, no organomegaly Extrem - none Skin - no rash, bruising or petechia Back - no CVA tenderness Neuro - strength is symmetric in upper and lower extremities; reflexes normal, speech normal, face symmetric Psych - affect appropriate   Assessment :    Sinus node dysfunction Sinus pauses up to 6.5 seconds Bradycardia Persistent atrial fibrillation Status post recent PVI using pulsed field ablation History of PVI with cryoablation 08/2022 Sleep apnea on CPAP therapy   Plan:  The patient will be brought to the hospital to undergo pacemaker placement.  Patient understands procedure and associated risks and is in agreement with plan. Continue current medications  Patient is advised to visit ED or contact this clinic should any significant or worsening cardiac symptoms occur before next visit.      Follow up visit:  3 months     Jon Raymond, PA-C [facility]        Electronically signed by Jon DELENA Champ, MD at 05/22/23 1612   Questionnaires  No completed forms available for this encounter.   Vital Signs Most recent update: 05/22/2023  2:17 PM BP  124/68    Pulse  74    Ht  6' 4 (1.93 m)    Wt  271 lb (122.9 kg)    SpO2  98%     BMI  32.99  kg/m     Orders Placed This Encounter  Future Labs/Procedures Expected by Expires  CASE REQUEST EP LAB: EP Pacemaker Insertion (Dual Chamber) [EP54 Custom] <redacted file path> As directed 05/21/2024   Discontinued Medications   Reason for Discontinue   meloxicam (MOBIC) 15 mg tablet   Therapy completed - CancelRx   methocarbamol (ROBAXIN) 500 MG tablet   Therapy completed - CancelRx   oxyCODONE  HCl (ROXICODONE ) 5 mg immediate release tablet   Therapy completed - CancelRx   promethazine (PHENERGAN) 25 MG tablet   Therapy completed - CancelRx   semaglutide-weight management (WEGOVY) 0.5 mg/0.5 mL SOAJ injection   Dose adjustment - CancelRx   Level of Service  Level of Service  PR OFFICE/OUTPATIENT ESTABLISHED MOD MDM 30 MIN [id number]     Instructions  After Visit Summary (Automatic SnapShot taken 05/22/2023) <redacted file path> Communication Routing History  None   All Charges for This Encounter  Code Description Service Date Service Provider Modifiers Qty  99214 PR OFFICE/OUTPATIENT ESTABLISHED MOD MDM 30 MIN 05/22/2023 Jon DELENA Champ, MD SHR 1  3017F PR COLORECTAL CANCER SCREENING RESULTS DOC&REV 05/22/2023 Jon DELENA Champ, MD 8P 1  [phone] PR DOCREV CUR MEDS BY ELIG CLIN 05/22/2023 Jon DELENA Champ, MD  1  [phone] PR DEP SCR NOT DOC, RNG 05/22/2023 Jon DELENA Champ, MD  1  [phone] PR DIAS BP LESS 90 05/22/2023 Jon DELENA Champ, MD  1  [phone] PR SYS BP LESS 140 05/22/2023 Jon DELENA Champ, MD  1  [phone] PR MOST RECENT SYSTOLIC BLOOD PRESSURE <130 MM HG 05/22/2023 Jon DELENA Champ, MD  1  [phone] PR MOST RECENT DIASTOLIC BLOOD PRESSURE < 80 MM HG 05/22/2023 Jon DELENA Champ, MD  1   Previous Visit  Date & Time 05/19/2023  2:06 PM  Department [facility] Encounter # [id number]   Other Encounter Related Information  Allergies & Medications <redacted file path>  Problem List <redacted file path>  History <redacted file path>  Patient-Entered  Questionnaires <redacted file path>   All Flowsheet Templates (all recorded)  Anthropometrics <redacted file path>  Custom Formula Data <redacted file path>  Encounter Vitals <redacted file path>  Kcentra Dose Calculator <redacted file path>  Pain Reassessment <redacted file path>  Vital Signs <redacted file path>  Vital Signs <redacted file path>   CSN  [id number]

## 2023-06-29 DIAGNOSIS — I10 Essential (primary) hypertension: Secondary | ICD-10-CM | POA: Diagnosis not present

## 2023-06-29 DIAGNOSIS — G4733 Obstructive sleep apnea (adult) (pediatric): Secondary | ICD-10-CM | POA: Diagnosis not present

## 2023-06-29 DIAGNOSIS — Z950 Presence of cardiac pacemaker: Secondary | ICD-10-CM | POA: Diagnosis not present

## 2023-06-29 DIAGNOSIS — I495 Sick sinus syndrome: Secondary | ICD-10-CM | POA: Diagnosis not present

## 2023-06-29 DIAGNOSIS — I455 Other specified heart block: Secondary | ICD-10-CM | POA: Diagnosis not present

## 2023-06-29 DIAGNOSIS — Z7901 Long term (current) use of anticoagulants: Secondary | ICD-10-CM | POA: Diagnosis not present

## 2023-06-29 DIAGNOSIS — Z9889 Other specified postprocedural states: Secondary | ICD-10-CM | POA: Diagnosis not present

## 2023-06-29 DIAGNOSIS — I480 Paroxysmal atrial fibrillation: Secondary | ICD-10-CM | POA: Diagnosis not present

## 2023-06-29 DIAGNOSIS — I77810 Thoracic aortic ectasia: Secondary | ICD-10-CM | POA: Diagnosis not present

## 2023-06-29 DIAGNOSIS — R001 Bradycardia, unspecified: Secondary | ICD-10-CM | POA: Diagnosis not present

## 2023-06-29 DIAGNOSIS — Z8679 Personal history of other diseases of the circulatory system: Secondary | ICD-10-CM | POA: Diagnosis not present

## 2023-06-29 DIAGNOSIS — E785 Hyperlipidemia, unspecified: Secondary | ICD-10-CM | POA: Diagnosis not present

## 2023-07-04 DIAGNOSIS — I495 Sick sinus syndrome: Secondary | ICD-10-CM | POA: Diagnosis not present

## 2023-07-07 DIAGNOSIS — I480 Paroxysmal atrial fibrillation: Secondary | ICD-10-CM | POA: Diagnosis not present

## 2023-07-12 ENCOUNTER — Other Ambulatory Visit: Payer: Self-pay | Admitting: Cardiology

## 2023-07-12 DIAGNOSIS — I10 Essential (primary) hypertension: Secondary | ICD-10-CM

## 2023-07-13 DIAGNOSIS — I480 Paroxysmal atrial fibrillation: Secondary | ICD-10-CM | POA: Diagnosis not present

## 2023-07-24 DIAGNOSIS — I10 Essential (primary) hypertension: Secondary | ICD-10-CM | POA: Diagnosis not present

## 2023-07-24 DIAGNOSIS — I495 Sick sinus syndrome: Secondary | ICD-10-CM | POA: Diagnosis not present

## 2023-08-23 DIAGNOSIS — I77810 Thoracic aortic ectasia: Secondary | ICD-10-CM | POA: Diagnosis not present

## 2023-08-23 DIAGNOSIS — I480 Paroxysmal atrial fibrillation: Secondary | ICD-10-CM | POA: Diagnosis not present

## 2023-08-23 DIAGNOSIS — I7141 Pararenal abdominal aortic aneurysm, without rupture: Secondary | ICD-10-CM | POA: Diagnosis not present

## 2023-08-23 DIAGNOSIS — E66811 Obesity, class 1: Secondary | ICD-10-CM | POA: Diagnosis not present

## 2023-08-23 DIAGNOSIS — I252 Old myocardial infarction: Secondary | ICD-10-CM | POA: Diagnosis not present

## 2023-09-27 DIAGNOSIS — Z9181 History of falling: Secondary | ICD-10-CM | POA: Diagnosis not present

## 2023-09-27 DIAGNOSIS — Z0000 Encounter for general adult medical examination without abnormal findings: Secondary | ICD-10-CM | POA: Diagnosis not present

## 2023-10-03 ENCOUNTER — Other Ambulatory Visit (HOSPITAL_COMMUNITY): Payer: Self-pay | Admitting: Physician Assistant

## 2023-10-03 DIAGNOSIS — M25562 Pain in left knee: Secondary | ICD-10-CM | POA: Diagnosis not present

## 2023-10-16 DIAGNOSIS — M25562 Pain in left knee: Secondary | ICD-10-CM | POA: Diagnosis not present

## 2023-10-19 ENCOUNTER — Other Ambulatory Visit (HOSPITAL_COMMUNITY): Payer: Self-pay | Admitting: Cardiology

## 2023-10-19 DIAGNOSIS — G4733 Obstructive sleep apnea (adult) (pediatric): Secondary | ICD-10-CM

## 2023-10-25 ENCOUNTER — Ambulatory Visit (HOSPITAL_COMMUNITY)
Admission: RE | Admit: 2023-10-25 | Discharge: 2023-10-25 | Disposition: A | Discharge status: Home / Self Care | Source: Ambulatory Visit | Attending: Cardiology | Admitting: Cardiology

## 2023-10-25 ENCOUNTER — Ambulatory Visit (HOSPITAL_COMMUNITY)
Admission: RE | Admit: 2023-10-25 | Discharge: 2023-10-25 | Disposition: A | Discharge status: Home / Self Care | Source: Ambulatory Visit | Attending: Physician Assistant | Admitting: Physician Assistant

## 2023-10-25 DIAGNOSIS — M25562 Pain in left knee: Secondary | ICD-10-CM | POA: Diagnosis not present

## 2023-10-25 DIAGNOSIS — G4733 Obstructive sleep apnea (adult) (pediatric): Secondary | ICD-10-CM | POA: Diagnosis not present

## 2023-10-25 DIAGNOSIS — Z1389 Encounter for screening for other disorder: Secondary | ICD-10-CM | POA: Diagnosis not present

## 2023-10-25 DIAGNOSIS — Z950 Presence of cardiac pacemaker: Secondary | ICD-10-CM | POA: Diagnosis not present

## 2023-10-25 NOTE — Progress Notes (Incomplete)
  Device system confirmed to be MRI conditional, with implant date > 6 weeks ago, and no evidence of abandoned or epicardial leads in review of most recent CXR  Device last cleared by EP Provider: Daphne Barrack 10/25/23  Clearance is good through for 1 year as long as parameters remain stable at time of check. If pt undergoes a cardiac device procedure during that time, they should be re-cleared.   Tachy-therapies to be programmed off if applicable with device back to pre-MRI settings after completion of exam.  [employer] - Programming recommendation received through [employer] App/Tablet  Oneita Bernardino Basta  10/25/2023 2:05 PM

## 2023-11-01 DIAGNOSIS — M25562 Pain in left knee: Secondary | ICD-10-CM | POA: Diagnosis not present

## 2023-11-07 DIAGNOSIS — Z950 Presence of cardiac pacemaker: Secondary | ICD-10-CM | POA: Diagnosis not present

## 2023-11-07 DIAGNOSIS — I495 Sick sinus syndrome: Secondary | ICD-10-CM | POA: Diagnosis not present

## 2023-12-11 DIAGNOSIS — M25562 Pain in left knee: Secondary | ICD-10-CM | POA: Diagnosis not present

## 2023-12-21 DIAGNOSIS — Z01818 Encounter for other preprocedural examination: Secondary | ICD-10-CM | POA: Diagnosis not present

## 2024-01-15 DIAGNOSIS — I77810 Thoracic aortic ectasia: Secondary | ICD-10-CM | POA: Diagnosis not present

## 2024-01-15 DIAGNOSIS — Z0000 Encounter for general adult medical examination without abnormal findings: Secondary | ICD-10-CM | POA: Diagnosis not present

## 2024-01-15 DIAGNOSIS — R7303 Prediabetes: Secondary | ICD-10-CM | POA: Diagnosis not present

## 2024-01-15 DIAGNOSIS — I10 Essential (primary) hypertension: Secondary | ICD-10-CM | POA: Diagnosis not present

## 2024-01-15 DIAGNOSIS — Z79899 Other long term (current) drug therapy: Secondary | ICD-10-CM | POA: Diagnosis not present

## 2024-01-15 DIAGNOSIS — E66811 Obesity, class 1: Secondary | ICD-10-CM | POA: Diagnosis not present

## 2024-01-15 DIAGNOSIS — I480 Paroxysmal atrial fibrillation: Secondary | ICD-10-CM | POA: Diagnosis not present

## 2024-01-15 DIAGNOSIS — H6122 Impacted cerumen, left ear: Secondary | ICD-10-CM | POA: Diagnosis not present

## 2024-01-15 DIAGNOSIS — Z23 Encounter for immunization: Secondary | ICD-10-CM | POA: Diagnosis not present

## 2024-01-15 DIAGNOSIS — E782 Mixed hyperlipidemia: Secondary | ICD-10-CM | POA: Diagnosis not present

## 2024-01-15 DIAGNOSIS — Z125 Encounter for screening for malignant neoplasm of prostate: Secondary | ICD-10-CM | POA: Diagnosis not present

## 2024-01-18 DIAGNOSIS — D485 Neoplasm of uncertain behavior of skin: Secondary | ICD-10-CM | POA: Diagnosis not present

## 2024-01-18 DIAGNOSIS — D0361 Melanoma in situ of right upper limb, including shoulder: Secondary | ICD-10-CM | POA: Diagnosis not present

## 2024-01-18 DIAGNOSIS — C4362 Malignant melanoma of left upper limb, including shoulder: Secondary | ICD-10-CM | POA: Diagnosis not present

## 2024-01-18 DIAGNOSIS — C4361 Malignant melanoma of right upper limb, including shoulder: Secondary | ICD-10-CM | POA: Diagnosis not present

## 2024-01-31 DIAGNOSIS — G4733 Obstructive sleep apnea (adult) (pediatric): Secondary | ICD-10-CM | POA: Diagnosis not present

## 2024-01-31 DIAGNOSIS — I4891 Unspecified atrial fibrillation: Secondary | ICD-10-CM | POA: Diagnosis not present

## 2024-01-31 DIAGNOSIS — E785 Hyperlipidemia, unspecified: Secondary | ICD-10-CM | POA: Diagnosis not present

## 2024-01-31 DIAGNOSIS — Z1211 Encounter for screening for malignant neoplasm of colon: Secondary | ICD-10-CM | POA: Diagnosis not present

## 2024-01-31 DIAGNOSIS — K635 Polyp of colon: Secondary | ICD-10-CM | POA: Diagnosis not present

## 2024-01-31 DIAGNOSIS — I252 Old myocardial infarction: Secondary | ICD-10-CM | POA: Diagnosis not present

## 2024-01-31 DIAGNOSIS — D126 Benign neoplasm of colon, unspecified: Secondary | ICD-10-CM | POA: Diagnosis not present

## 2024-01-31 DIAGNOSIS — I10 Essential (primary) hypertension: Secondary | ICD-10-CM | POA: Diagnosis not present

## 2024-01-31 DIAGNOSIS — Z950 Presence of cardiac pacemaker: Secondary | ICD-10-CM | POA: Diagnosis not present

## 2024-01-31 DIAGNOSIS — Z860100 Personal history of colon polyps, unspecified: Secondary | ICD-10-CM | POA: Diagnosis not present

## 2024-01-31 DIAGNOSIS — Z7901 Long term (current) use of anticoagulants: Secondary | ICD-10-CM | POA: Diagnosis not present

## 2024-01-31 DIAGNOSIS — D122 Benign neoplasm of ascending colon: Secondary | ICD-10-CM | POA: Diagnosis not present

## 2024-02-07 DIAGNOSIS — D0361 Melanoma in situ of right upper limb, including shoulder: Secondary | ICD-10-CM | POA: Diagnosis not present
